# Patient Record
Sex: Female | Born: 1990 | Hispanic: No | Marital: Single | Smoking: Former smoker
Health system: Southern US, Community
[De-identification: ages and names within clinical notes are randomized; demographics above are authoritative.]

## PROBLEM LIST (undated history)

## (undated) ENCOUNTER — Emergency Department: Admission: EM | Payer: Self-pay | Source: Home / Self Care

## (undated) DIAGNOSIS — G4733 Obstructive sleep apnea (adult) (pediatric): Secondary | ICD-10-CM

## (undated) DIAGNOSIS — E119 Type 2 diabetes mellitus without complications: Secondary | ICD-10-CM

## (undated) DIAGNOSIS — M329 Systemic lupus erythematosus, unspecified: Secondary | ICD-10-CM

## (undated) DIAGNOSIS — E079 Disorder of thyroid, unspecified: Secondary | ICD-10-CM

## (undated) DIAGNOSIS — K219 Gastro-esophageal reflux disease without esophagitis: Secondary | ICD-10-CM

## (undated) DIAGNOSIS — J449 Chronic obstructive pulmonary disease, unspecified: Secondary | ICD-10-CM

## (undated) DIAGNOSIS — F329 Major depressive disorder, single episode, unspecified: Secondary | ICD-10-CM

## (undated) DIAGNOSIS — J42 Unspecified chronic bronchitis: Secondary | ICD-10-CM

## (undated) DIAGNOSIS — IMO0001 Reserved for inherently not codable concepts without codable children: Secondary | ICD-10-CM

## (undated) DIAGNOSIS — R0789 Other chest pain: Secondary | ICD-10-CM

## (undated) DIAGNOSIS — R7303 Prediabetes: Secondary | ICD-10-CM

## (undated) DIAGNOSIS — F419 Anxiety disorder, unspecified: Secondary | ICD-10-CM

## (undated) DIAGNOSIS — K589 Irritable bowel syndrome without diarrhea: Secondary | ICD-10-CM

## (undated) DIAGNOSIS — I10 Essential (primary) hypertension: Secondary | ICD-10-CM

## (undated) DIAGNOSIS — J40 Bronchitis, not specified as acute or chronic: Secondary | ICD-10-CM

## (undated) HISTORY — DX: Disorder of thyroid, unspecified: E07.9

## (undated) HISTORY — DX: Major depressive disorder, single episode, unspecified: F32.9

## (undated) HISTORY — PX: UPPER ENDOSCOPY W/ SCLEROTHERAPY: SHX2606

## (undated) HISTORY — DX: Anxiety disorder, unspecified: F41.9

## (undated) HISTORY — DX: Essential (primary) hypertension: I10

## (undated) HISTORY — DX: Chronic obstructive pulmonary disease, unspecified: J44.9

## (undated) HISTORY — DX: Gastro-esophageal reflux disease without esophagitis: K21.9

## (undated) HISTORY — DX: Reserved for inherently not codable concepts without codable children: IMO0001

---

## 2013-08-20 ENCOUNTER — Emergency Department: Payer: Self-pay | Admitting: Emergency Medicine

## 2013-08-20 LAB — COMPREHENSIVE METABOLIC PANEL
Alkaline Phosphatase: 126 U/L (ref 50–136)
Anion Gap: 5 — ABNORMAL LOW (ref 7–16)
BUN: 8 mg/dL (ref 7–18)
Bilirubin,Total: 0.3 mg/dL (ref 0.2–1.0)
Calcium, Total: 9.1 mg/dL (ref 8.5–10.1)
Chloride: 103 mmol/L (ref 98–107)
Creatinine: 0.91 mg/dL (ref 0.60–1.30)
EGFR (African American): >60
Glucose: 115 mg/dL — ABNORMAL HIGH (ref 65–99)
SGOT(AST): 15 U/L (ref 15–37)
SGPT (ALT): 20 U/L (ref 12–78)
Sodium: 136 mmol/L (ref 136–145)
Total Protein: 8.3 g/dL — ABNORMAL HIGH (ref 6.4–8.2)

## 2013-08-20 LAB — PREGNANCY, URINE: Pregnancy Test, Urine: NEGATIVE m[IU]/mL

## 2013-08-20 LAB — CBC
HGB: 12.4 g/dL (ref 12.0–16.0)
MCHC: 34.3 g/dL (ref 32.0–36.0)
MCV: 75 fL — ABNORMAL LOW (ref 80–100)
RBC: 4.82 10*6/uL (ref 3.80–5.20)
WBC: 8.9 10*3/uL (ref 3.6–11.0)

## 2013-08-20 LAB — LIPASE, BLOOD: Lipase: 93 U/L (ref 73–393)

## 2013-08-20 LAB — URINALYSIS, COMPLETE
Bilirubin,UR: NEGATIVE
Blood: NEGATIVE
Glucose,UR: NEGATIVE mg/dL (ref 0–75)
Ketone: NEGATIVE
Leukocyte Esterase: NEGATIVE
Ph: 7 (ref 4.5–8.0)
Protein: 100
RBC,UR: <1 /HPF (ref 0–5)
Specific Gravity: 1.027 (ref 1.003–1.030)
WBC UR: 2 /HPF (ref 0–5)

## 2013-09-15 ENCOUNTER — Ambulatory Visit: Payer: Self-pay | Admitting: Gastroenterology

## 2013-12-18 ENCOUNTER — Ambulatory Visit: Payer: Self-pay | Admitting: Gastroenterology

## 2014-02-08 ENCOUNTER — Ambulatory Visit (INDEPENDENT_AMBULATORY_CARE_PROVIDER_SITE_OTHER): Payer: PRIVATE HEALTH INSURANCE

## 2014-02-08 ENCOUNTER — Encounter: Payer: Self-pay | Admitting: Podiatry

## 2014-02-08 ENCOUNTER — Ambulatory Visit (INDEPENDENT_AMBULATORY_CARE_PROVIDER_SITE_OTHER): Payer: PRIVATE HEALTH INSURANCE | Admitting: Podiatry

## 2014-02-08 VITALS — BP 142/80 | HR 82 | Resp 16 | Ht 62.0 in | Wt 290.0 lb

## 2014-02-08 DIAGNOSIS — Q6650 Congenital pes planus, unspecified foot: Secondary | ICD-10-CM

## 2014-02-08 DIAGNOSIS — M722 Plantar fascial fibromatosis: Secondary | ICD-10-CM

## 2014-02-08 DIAGNOSIS — L723 Sebaceous cyst: Secondary | ICD-10-CM

## 2014-02-08 DIAGNOSIS — M6740 Ganglion, unspecified site: Secondary | ICD-10-CM

## 2014-02-08 NOTE — Progress Notes (Signed)
   Subjective:    Patient ID: Katrina Meyers, female    DOB: December 23, 1990, 23 y.o.   MRN: [id number]  HPI Comments: KNOTS ON THE TOP OF BOTH FEET, ITS BEEN THERE A FEW MONTHS. DO HAVE NUMBNESS IN BOTH FEET AND GOES UP THE LEGS , STARTS AT THE SIDES. AND THE LEFT FOOT HAS SOME SWELLING , WALKING UP STAIRS THE ANKLES CRACK A LOT      Review of Systems  All other systems reviewed and are negative.      Objective:   Physical Exam: I have reviewed her past medical history medications allergies surgeries social history and review of systems. Pulses are strongly palpable bilateral neurologic sensorium is intact per since once the monofilament. Deep tendon reflexes are intact bilateral. Muscle strength is 5 over 5 dorsiflexors plantar flexors inverters everters all intrinsic musculature intact orthopedic evaluation demonstrates pes planus noted bilateral. Cutaneous evaluation does demonstrate supple while hydrated cutis with exception of ganglion tumor to the dorsal aspect of the extensor tendons bilateral foot. Very early minor bunion deformities. Positive pain on palpation medial continued tubercles bilateral heels.        Assessment & Plan:  Assessment: Pes planus bilateral. Ganglion cyst I lateral.  Plan: Discussed surgical needs for ganglion cyst. She was scanned for orthotics today due to her flat feet and heel pain.

## 2014-02-19 ENCOUNTER — Encounter: Payer: Self-pay | Admitting: *Deleted

## 2014-02-19 NOTE — Progress Notes (Signed)
Sent pt post card letting her know orthotics are here. 

## 2014-03-26 DIAGNOSIS — M79609 Pain in unspecified limb: Secondary | ICD-10-CM | POA: Insufficient documentation

## 2015-06-18 ENCOUNTER — Encounter: Payer: Self-pay | Admitting: Emergency Medicine

## 2015-06-18 ENCOUNTER — Emergency Department: Payer: Self-pay

## 2015-06-18 ENCOUNTER — Emergency Department
Admission: EM | Admit: 2015-06-18 | Discharge: 2015-06-18 | Disposition: A | Discharge status: Home / Self Care | Payer: Self-pay | Attending: Emergency Medicine | Admitting: Emergency Medicine

## 2015-06-18 DIAGNOSIS — R42 Dizziness and giddiness: Secondary | ICD-10-CM | POA: Insufficient documentation

## 2015-06-18 DIAGNOSIS — R51 Headache: Secondary | ICD-10-CM | POA: Insufficient documentation

## 2015-06-18 DIAGNOSIS — R519 Headache, unspecified: Secondary | ICD-10-CM

## 2015-06-18 DIAGNOSIS — Z720 Tobacco use: Secondary | ICD-10-CM | POA: Insufficient documentation

## 2015-06-18 DIAGNOSIS — I10 Essential (primary) hypertension: Secondary | ICD-10-CM | POA: Insufficient documentation

## 2015-06-18 DIAGNOSIS — H53149 Visual discomfort, unspecified: Secondary | ICD-10-CM | POA: Insufficient documentation

## 2015-06-18 MED ORDER — MECLIZINE HCL 25 MG PO TABS
25.0000 mg | ORAL_TABLET | Freq: Once | ORAL | Status: AC
  Administered 2015-06-18: 25 mg via ORAL
  Filled 2015-06-18: qty 1

## 2015-06-18 MED ORDER — SODIUM CHLORIDE 0.9 % IV BOLUS (SEPSIS)
1000.0000 mL | Freq: Once | INTRAVENOUS | Status: AC
  Administered 2015-06-18: 1000 mL via INTRAVENOUS

## 2015-06-18 MED ORDER — MECLIZINE HCL 32 MG PO TABS: 32.0000 mg | ORAL_TABLET | Freq: Three times a day (TID) | ORAL | Status: DC | PRN

## 2015-06-18 MED ORDER — BUTALBITAL-APAP-CAFFEINE 50-325-40 MG PO TABS: 1.0000 | ORAL_TABLET | Freq: Four times a day (QID) | ORAL | Status: AC | PRN

## 2015-06-18 MED ORDER — PROCHLORPERAZINE EDISYLATE 5 MG/ML IJ SOLN
10.0000 mg | Freq: Once | INTRAMUSCULAR | Status: AC
  Administered 2015-06-18: 10 mg via INTRAVENOUS
  Filled 2015-06-18: qty 2

## 2015-06-18 MED ORDER — DIPHENHYDRAMINE HCL 50 MG/ML IJ SOLN
25.0000 mg | Freq: Once | INTRAMUSCULAR | Status: AC
  Administered 2015-06-18: 25 mg via INTRAVENOUS
  Filled 2015-06-18: qty 1

## 2015-06-18 NOTE — Discharge Instructions (Signed)
°Dizziness ° Dizziness means you feel unsteady or lightheaded. You might feel like you are going to pass out (faint). °HOME CARE  °· Drink enough fluids to keep your pee (urine) clear or pale yellow. °· Take your medicines exactly as told by your doctor. If you take blood pressure medicine, always stand up slowly from the lying or sitting position. Hold on to something to steady yourself. °· If you need to stand in one place for a long time, move your legs often. Tighten and relax your leg muscles. °· Have someone stay with you until you feel okay. °· Do not drive or use heavy machinery if you feel dizzy. °· Do not drink alcohol. °GET HELP RIGHT AWAY IF:  °· You feel dizzy or lightheaded and it gets worse. °· You feel sick to your stomach (nauseous), or you throw up (vomit). °· You have trouble talking or walking. °· You feel weak or have trouble using your arms, hands, or legs. °· You cannot think clearly or have trouble forming sentences. °· You have chest pain, belly (abdominal) pain, sweating, or you are short of breath. °· Your vision changes. °· You are bleeding. °· You have problems from your medicine that seem to be getting worse. °MAKE SURE YOU:  °· Understand these instructions. °· Will watch your condition. °· Will get help right away if you are not doing well or get worse. °Document Released: 09/20/2011 Document Revised: 12/24/2011 Document Reviewed: 09/20/2011 °ExitCare® Patient Information ©2015 [employer]. This information is not intended to replace advice given to you by your health care provider. Make sure you discuss any questions you have with your health care provider. °Headaches, Frequently Asked Questions °MIGRAINE HEADACHES °Q: What is migraine? What causes it? How can I treat it? °A: Generally, migraine headaches begin as a dull ache. Then they develop into a constant, throbbing, and pulsating pain. You may experience pain at the temples. You may experience pain at the front or back of  one or both sides of the head. The pain is usually accompanied by a combination of: °· Nausea. °· Vomiting. °· Sensitivity to light and noise. °Some people (about 15%) experience an aura (see below) before an attack. The cause of migraine is believed to be chemical reactions in the brain. Treatment for migraine may include over-the-counter or prescription medications. It may also include self-help techniques. These include relaxation training and biofeedback.  °Q: What is an aura? °A: About 15% of people with migraine get an "aura". This is a sign of neurological symptoms that occur before a migraine headache. You may see wavy or jagged lines, dots, or flashing lights. You might experience tunnel vision or blind spots in one or both eyes. The aura can include visual or auditory hallucinations (something imagined). It may include disruptions in smell (such as strange odors), taste or touch. Other symptoms include: °· Numbness. °· A "pins and needles" sensation. °· Difficulty in recalling or speaking the correct word. °These neurological events may last as long as 60 minutes. These symptoms will fade as the headache begins. °Q: What is a trigger? °A: Certain physical or environmental factors can lead to or "trigger" a migraine. These include: °· Foods. °· Hormonal changes. °· Weather. °· Stress. °It is important to remember that triggers are different for everyone. To help prevent migraine attacks, you need to figure out which triggers affect you. Keep a headache diary. This is a good way to track triggers. The diary will help you talk to   your healthcare professional about your condition. °Q: Does weather affect migraines? °A: Bright sunshine, hot, humid conditions, and drastic changes in barometric pressure may lead to, or "trigger," a migraine attack in some people. But studies have shown that weather does not act as a trigger for everyone with migraines. °Q: What is the link between migraine and hormones? °A:  Hormones start and regulate many of your body's functions. Hormones keep your body in balance within a constantly changing environment. The levels of hormones in your body are unbalanced at times. Examples are during menstruation, pregnancy, or menopause. That can lead to a migraine attack. In fact, about three quarters of all women with migraine report that their attacks are related to the menstrual cycle.  °Q: Is there an increased risk of stroke for migraine sufferers? °A: The likelihood of a migraine attack causing a stroke is very remote. That is not to say that migraine sufferers cannot have a stroke associated with their migraines. In persons under age 40, the most common associated factor for stroke is migraine headache. But over the course of a person's normal life span, the occurrence of migraine headache may actually be associated with a reduced risk of dying from cerebrovascular disease due to stroke.  °Q: What are acute medications for migraine? °A: Acute medications are used to treat the pain of the headache after it has started. Examples over-the-counter medications, NSAIDs, ergots, and triptans.  °Q: What are the triptans? °A: Triptans are the newest class of abortive medications. They are specifically targeted to treat migraine. Triptans are vasoconstrictors. They moderate some chemical reactions in the brain. The triptans work on receptors in your brain. Triptans help to restore the balance of a neurotransmitter called serotonin. Fluctuations in levels of serotonin are thought to be a main cause of migraine.  °Q: Are over-the-counter medications for migraine effective? °A: Over-the-counter, or "OTC," medications may be effective in relieving mild to moderate pain and associated symptoms of migraine. But you should see your caregiver before beginning any treatment regimen for migraine.  °Q: What are preventive medications for migraine? °A: Preventive medications for migraine are sometimes referred  to as "prophylactic" treatments. They are used to reduce the frequency, severity, and length of migraine attacks. Examples of preventive medications include antiepileptic medications, antidepressants, beta-blockers, calcium channel blockers, and NSAIDs (nonsteroidal anti-inflammatory drugs). °Q: Why are anticonvulsants used to treat migraine? °A: During the past few years, there has been an increased interest in antiepileptic drugs for the prevention of migraine. They are sometimes referred to as "anticonvulsants". Both epilepsy and migraine may be caused by similar reactions in the brain.  °Q: Why are antidepressants used to treat migraine? °A: Antidepressants are typically used to treat people with depression. They may reduce migraine frequency by regulating chemical levels, such as serotonin, in the brain.  °Q: What alternative therapies are used to treat migraine? °A: The term "alternative therapies" is often used to describe treatments considered outside the scope of conventional Western medicine. Examples of alternative therapy include acupuncture, acupressure, and yoga. Another common alternative treatment is herbal therapy. Some herbs are believed to relieve headache pain. Always discuss alternative therapies with your caregiver before proceeding. Some herbal products contain arsenic and other toxins. °TENSION HEADACHES °Q: What is a tension-type headache? What causes it? How can I treat it? °A: Tension-type headaches occur randomly. They are often the result of temporary stress, anxiety, fatigue, or anger. Symptoms include soreness in your temples, a tightening band-like sensation around your head (a "  vice-like" ache). Symptoms can also include a pulling feeling, pressure sensations, and contracting head and neck muscles. The headache begins in your forehead, temples, or the back of your head and neck. Treatment for tension-type headache may include over-the-counter or prescription medications. Treatment  may also include self-help techniques such as relaxation training and biofeedback. °CLUSTER HEADACHES °Q: What is a cluster headache? What causes it? How can I treat it? °A: Cluster headache gets its name because the attacks come in groups. The pain arrives with little, if any, warning. It is usually on one side of the head. A tearing or bloodshot eye and a runny nose on the same side of the headache may also accompany the pain. Cluster headaches are believed to be caused by chemical reactions in the brain. They have been described as the most severe and intense of any headache type. Treatment for cluster headache includes prescription medication and oxygen. °SINUS HEADACHES °Q: What is a sinus headache? What causes it? How can I treat it? °A: When a cavity in the bones of the face and skull (a sinus) becomes inflamed, the inflammation will cause localized pain. This condition is usually the result of an allergic reaction, a tumor, or an infection. If your headache is caused by a sinus blockage, such as an infection, you will probably have a fever. An x-ray will confirm a sinus blockage. Your caregiver's treatment might include antibiotics for the infection, as well as antihistamines or decongestants.  °REBOUND HEADACHES °Q: What is a rebound headache? What causes it? How can I treat it? °A: A pattern of taking acute headache medications too often can lead to a condition known as "rebound headache." A pattern of taking too much headache medication includes taking it more than 2 days per week or in excessive amounts. That means more than the label or a caregiver advises. With rebound headaches, your medications not only stop relieving pain, they actually begin to cause headaches. Doctors treat rebound headache by tapering the medication that is being overused. Sometimes your caregiver will gradually substitute a different type of treatment or medication. Stopping may be a challenge. Regularly overusing a medication  increases the potential for serious side effects. Consult a caregiver if you regularly use headache medications more than 2 days per week or more than the label advises. °ADDITIONAL QUESTIONS AND ANSWERS °Q: What is biofeedback? °A: Biofeedback is a self-help treatment. Biofeedback uses special equipment to monitor your body's involuntary physical responses. Biofeedback monitors: °· Breathing. °· Pulse. °· Heart rate. °· Temperature. °· Muscle tension. °· Brain activity. °Biofeedback helps you refine and perfect your relaxation exercises. You learn to control the physical responses that are related to stress. Once the technique has been mastered, you do not need the equipment any more. °Q: Are headaches hereditary? °A: Four out of five (80%) of people that suffer report a family history of migraine. Scientists are not sure if this is genetic or a family predisposition. Despite the uncertainty, a child has a 50% chance of having migraine if one parent suffers. The child has a 75% chance if both parents suffer.  °Q: Can children get headaches? °A: By the time they reach high school, most young people have experienced some type of headache. Many safe and effective approaches or medications can prevent a headache from occurring or stop it after it has begun.  °Q: What type of doctor should I see to diagnose and treat my headache? °A: Start with your primary caregiver. Discuss his or her   experience and approach to headaches. Discuss methods of classification, diagnosis, and treatment. Your caregiver may decide to recommend you to a headache specialist, depending upon your symptoms or other physical conditions. Having diabetes, allergies, etc., may require a more comprehensive and inclusive approach to your headache. The [employer] will provide, upon request, a list of NHF physician members in your state. °Document Released: 12/22/2003 Document Revised: 12/24/2011 Document Reviewed: 05/31/2008 °ExitCare®  Patient Information ©2015 [employer]. This information is not intended to replace advice given to you by your health care provider. Make sure you discuss any questions you have with your health care provider. ° °

## 2015-06-18 NOTE — ED Notes (Signed)
This nurse made 2 attempts to start an IV, both attempts unsuccessful.

## 2015-06-18 NOTE — ED Provider Notes (Signed)
[facility] Emergency Department Provider Note  ____________________________________________  Time seen: Approximately 930 AM  I have reviewed the triage vital signs and the nursing notes.   HISTORY  Chief Complaint Dizziness    HPI Katrina Meyers is a 24 y.o. female with multiple episodes of headache and dizziness was presenting with an episode of headache and dizziness. Said that the headache was sudden onset headache that started last night at maximum intensity. She says it was a 12 out of 10. She says since then it has resolved to a 9 out of 10. The headache is frontal and associated with dizziness with movement. She denies any ringing or roaring in her ears. The light bothers her. She has had 2 other episodes such as this in the past, the first 7 years ago. She has seen a primary care doctor but never been evaluate by a neurologist. She has no history of cerebral aneurysm in her family.   Past Medical History  Diagnosis Date  . Thyroid disease   . Hypertension   . Reflux     There are no active problems to display for this patient.   Past Surgical History  Procedure Laterality Date  . Upper endoscopy w/ sclerotherapy      No current outpatient prescriptions on file.  Allergies Reglan  History reviewed. No pertinent family history.  Social History Social History  Substance Use Topics  . Smoking status: Current Some Day Smoker  . Smokeless tobacco: Never Used  . Alcohol Use: No    Review of Systems Constitutional: No fever/chills Eyes: Photophobia  ENT: No sore throat. Cardiovascular: Denies chest pain. Respiratory: Denies shortness of breath. Gastrointestinal: No abdominal pain.  No nausea, no vomiting.  No diarrhea.  No constipation. Genitourinary: Negative for dysuria. Musculoskeletal: Negative for back pain. Skin: Negative for rash. Neurological: Negative focal weakness or numbness.  10-point ROS otherwise  negative.  ____________________________________________   PHYSICAL EXAM:  VITAL SIGNS: ED Triage Vitals  Enc Vitals Group     BP 06/18/15 0819 115/71 mmHg     Pulse Rate 06/18/15 0819 87     Resp 06/18/15 0819 18     Temp 06/18/15 0819 98 F (36.7 C)     Temp Source 06/18/15 0819 Oral     SpO2 06/18/15 0819 98 %     Weight 06/18/15 0819 291 lb (131.997 kg)     Height 06/18/15 0819  (1.575 m)     Head Cir --      Peak Flow --      Pain Score 06/18/15 0819 9     Pain Loc --      Pain Edu? --      Excl. in GC? --     Constitutional: Alert and oriented. Well appearing and in no acute distress. Eyes: Conjunctivae are normal. PERRL. EOMI. Head: Atraumatic. Nose: No congestion/rhinnorhea. Mouth/Throat: Mucous membranes are moist.  Oropharynx non-erythematous. Neck: No stridor.   Cardiovascular: Normal rate, regular rhythm. Grossly normal heart sounds.  Good peripheral circulation. Respiratory: Normal respiratory effort.  No retractions. Lungs CTAB. Gastrointestinal: Soft and nontender. No distention. No abdominal bruits. No CVA tenderness. Musculoskeletal: No lower extremity tenderness nor edema.  No joint effusions. Neurologic:  Normal speech and language. No gross focal neurologic deficits are appreciated. No gait instability. No ataxia on finger to nose or heel shin. No nystagmus. No meningismus. The neck is supple and ranges freely.Skin:  Skin is warm, dry and intact. No rash noted. Psychiatric:  Mood and affect are normal. Speech and behavior are normal.  ____________________________________________   LABS (all labs ordered are listed, but only abnormal results are displayed)  Labs Reviewed - No data to display ____________________________________________  EKG   ____________________________________________  RADIOLOGY  Normal CT of the  brain. ____________________________________________   PROCEDURES    ____________________________________________   INITIAL IMPRESSION / ASSESSMENT AND PLAN / ED COURSE  Pertinent labs & imaging results that were available during my care of the patient were reviewed by me and considered in my medical decision making (see chart for details).  ----------------------------------------- 11:26 AM on 06/18/2015 -----------------------------------------  Patient is now headache as well as dizziness free after meds. Discussed with her that this could possibly be migraine but I'm concerned because of the sudden onset aspect of the headache. She is aware of my concern for cerebral aneurysm and the potential effects of rupture.  She reiterates that she has no history of cerebral aneurysm in her family. We discussed LP but the patient does not want the lumbar puncture at this time. She will follow up with neurology. ____________________________________________   FINAL CLINICAL IMPRESSION(S) / ED DIAGNOSES  Acute headache with vertigo. Initial visit.    Myrna Blazer, MD 06/18/15 [id number]

## 2015-06-18 NOTE — ED Notes (Signed)
Reports dizziness since yesterday.  States she has also had a ha and has been out of her bp meds for a while. Also reports diarrhea. States she needs a note for work also.

## 2016-06-28 DIAGNOSIS — E039 Hypothyroidism, unspecified: Secondary | ICD-10-CM | POA: Insufficient documentation

## 2016-10-15 DIAGNOSIS — R87612 Low grade squamous intraepithelial lesion on cytologic smear of cervix (LGSIL): Secondary | ICD-10-CM | POA: Insufficient documentation

## 2016-10-23 ENCOUNTER — Telehealth: Payer: Self-pay | Admitting: Podiatry

## 2016-10-23 NOTE — Telephone Encounter (Signed)
Left message with father about orthotics that has been here since 2015. He states that he will give her the message and have her call back

## 2017-03-14 ENCOUNTER — Encounter: Payer: Self-pay | Admitting: Emergency Medicine

## 2017-03-14 ENCOUNTER — Emergency Department
Admission: EM | Admit: 2017-03-14 | Discharge: 2017-03-14 | Disposition: A | Discharge status: Home / Self Care | Payer: 59 | Attending: Emergency Medicine | Admitting: Emergency Medicine

## 2017-03-14 ENCOUNTER — Emergency Department: Payer: 59

## 2017-03-14 DIAGNOSIS — I10 Essential (primary) hypertension: Secondary | ICD-10-CM | POA: Insufficient documentation

## 2017-03-14 DIAGNOSIS — F17200 Nicotine dependence, unspecified, uncomplicated: Secondary | ICD-10-CM | POA: Diagnosis not present

## 2017-03-14 DIAGNOSIS — K625 Hemorrhage of anus and rectum: Secondary | ICD-10-CM | POA: Diagnosis not present

## 2017-03-14 DIAGNOSIS — R109 Unspecified abdominal pain: Secondary | ICD-10-CM

## 2017-03-14 HISTORY — DX: Irritable bowel syndrome, unspecified: K58.9

## 2017-03-14 LAB — COMPREHENSIVE METABOLIC PANEL
ALK PHOS: 107 U/L (ref 38–126)
ALT: 18 U/L (ref 14–54)
ANION GAP: 7 (ref 5–15)
AST: 18 U/L (ref 15–41)
Albumin: 3.5 g/dL (ref 3.5–5.0)
BILIRUBIN TOTAL: 0.5 mg/dL (ref 0.3–1.2)
BUN: 9 mg/dL (ref 6–20)
CALCIUM: 9.3 mg/dL (ref 8.9–10.3)
CO2: 28 mmol/L (ref 22–32)
Chloride: 103 mmol/L (ref 101–111)
Creatinine, Ser: 0.79 mg/dL (ref 0.44–1.00)
Glucose, Bld: 92 mg/dL (ref 65–99)
Potassium: 4 mmol/L (ref 3.5–5.1)
Sodium: 138 mmol/L (ref 135–145)
TOTAL PROTEIN: 8.4 g/dL — AB (ref 6.5–8.1)

## 2017-03-14 LAB — URINALYSIS, COMPLETE (UACMP) WITH MICROSCOPIC
BILIRUBIN URINE: NEGATIVE
Bacteria, UA: NONE SEEN
Glucose, UA: NEGATIVE mg/dL
HGB URINE DIPSTICK: NEGATIVE
KETONES UR: NEGATIVE mg/dL
LEUKOCYTES UA: NEGATIVE
NITRITE: NEGATIVE
PH: 5 (ref 5.0–8.0)
Protein, ur: 100 mg/dL — AB
SPECIFIC GRAVITY, URINE: 1.025 (ref 1.005–1.030)

## 2017-03-14 LAB — CBC
HCT: 38.6 % (ref 35.0–47.0)
HEMOGLOBIN: 12.7 g/dL (ref 12.0–16.0)
MCH: 25 pg — ABNORMAL LOW (ref 26.0–34.0)
MCHC: 33 g/dL (ref 32.0–36.0)
MCV: 75.8 fL — ABNORMAL LOW (ref 80.0–100.0)
Platelets: 271 10*3/uL (ref 150–440)
RBC: 5.09 MIL/uL (ref 3.80–5.20)
RDW: 17.1 % — ABNORMAL HIGH (ref 11.5–14.5)
WBC: 8.5 10*3/uL (ref 3.6–11.0)

## 2017-03-14 LAB — TYPE AND SCREEN
ABO/RH(D): O NEG
ANTIBODY SCREEN: NEGATIVE

## 2017-03-14 LAB — POCT PREGNANCY, URINE: PREG TEST UR: NEGATIVE

## 2017-03-14 MED ORDER — LINACLOTIDE 72 MCG PO CAPS: 72.0000 ug | ORAL_CAPSULE | Freq: Every day | ORAL | 1 refills | Status: DC

## 2017-03-14 NOTE — ED Triage Notes (Signed)
Pt reports that she is having abd pain and right flank pain - she has a history of IBS and rotates from diarrhea to constipation - yesterday she started with bright red blood in stool and is concerned because this is a new symptom

## 2017-03-14 NOTE — ED Notes (Signed)
Patient transported to radiology

## 2017-03-14 NOTE — ED Provider Notes (Signed)
[facility] Emergency Department Provider Note       Time seen: ----------------------------------------- 2:37 PM on 03/14/2017 -----------------------------------------     I have reviewed the triage vital signs and the nursing notes.   HISTORY   Chief Complaint Blood In Stools; Flank Pain; and Abdominal Pain    HPI Katrina Meyers is a 26 y.o. female who presents to the ED for abdominal pain and flank pain. Patient reports a history of IBS and she varies from having diarrhea to constipation. Yesterday she started with bright red blood in her stool was concerned because she has never had this before. She does not have any pain when she has a bowel movement. She denies fevers, chills, chest pain, shortness of breath or vomiting.   Past Medical History:  Diagnosis Date  . Hypertension   . IBS (irritable bowel syndrome)   . Reflux   . Thyroid disease     There are no active problems to display for this patient.   Past Surgical History:  Procedure Laterality Date  . UPPER ENDOSCOPY W/ SCLEROTHERAPY      Allergies Reglan [metoclopramide]  Social History Social History  Substance Use Topics  . Smoking status: Current Some Day Smoker  . Smokeless tobacco: Never Used  . Alcohol use No    Review of Systems Constitutional: Negative for fever. Cardiovascular: Negative for chest pain. Respiratory: Negative for shortness of breath. Gastrointestinal: Positive for abdominal pain, rectal bleeding Genitourinary: Negative for dysuria. Musculoskeletal: Negative for back pain. Skin: Negative for rash. Neurological: Negative for headaches, focal weakness or numbness.  All systems negative/normal/unremarkable except as stated in the HPI  ____________________________________________   PHYSICAL EXAM:  VITAL SIGNS: ED Triage Vitals  Enc Vitals Group     BP 03/14/17 1259 (!) 163/105     Pulse Rate 03/14/17 1259 86     Resp 03/14/17 1259 16   Temp 03/14/17 1259 98.6 F (37 C)     Temp Source 03/14/17 1259 Oral     SpO2 03/14/17 1259 94 %     Weight 03/14/17 1229 (!) 317 lb (143.8 kg)     Height 03/14/17 1229 5\' 2"  (1.575 m)     Head Circumference --      Peak Flow --      Pain Score 03/14/17 1229 10     Pain Loc --      Pain Edu? --      Excl. in GC? --     Constitutional: Alert and oriented. Well appearing and in no distress.Obese Eyes: Conjunctivae are normal. Normal extraocular movements. ENT   Head: Normocephalic and atraumatic.   Nose: No congestion/rhinnorhea.   Mouth/Throat: Mucous membranes are moist.   Neck: No stridor. Cardiovascular: Normal rate, regular rhythm. No murmurs, rubs, or gallops. Respiratory: Normal respiratory effort without tachypnea nor retractions. Breath sounds are clear and equal bilaterally. No wheezes/rales/rhonchi. Gastrointestinal: Nonfocal tenderness, no rebound or guarding. Normal bowel sounds.Mildly tender rectal examination, no blood or hemorrhoids are present, no stool Musculoskeletal: Nontender with normal range of motion in extremities. No lower extremity tenderness nor edema. Neurologic:  Normal speech and language. No gross focal neurologic deficits are appreciated.  Skin:  Skin is warm, dry and intact. No rash noted. Psychiatric: Mood and affect are normal. Speech and behavior are normal.  ___________________________________________  ED COURSE:  Pertinent labs & imaging results that were available during my care of the patient were reviewed by me and considered in my medical decision making (see chart  for details). Patient presents for abdominal pain, we will assess with labs and imaging as indicated.   Procedures ____________________________________________   LABS (pertinent positives/negatives)  Labs Reviewed  COMPREHENSIVE METABOLIC PANEL - Abnormal; Notable for the following:       Result Value   Total Protein 8.4 (*)    All other components within  normal limits  CBC - Abnormal; Notable for the following:    MCV 75.8 (*)    MCH 25.0 (*)    RDW 17.1 (*)    All other components within normal limits  URINALYSIS, COMPLETE (UACMP) WITH MICROSCOPIC  POCT PREGNANCY, URINE  POC URINE PREG, ED  POC OCCULT BLOOD, ED  TYPE AND SCREEN    RADIOLOGY  Abdomen 2 view Is unremarkable ____________________________________________  FINAL ASSESSMENT AND PLAN  Abdominal pain, rectal bleeding   Plan: Patient's labs and imaging were dictated above. Patient had presented forabdominal pain and rectal bleeding. No specific etiology was discovered. Labs and workup here have been negative. She is stable for outpatient follow-up.   Emily FilbertWilliams, Jonathan E, MD   Note: This note was generated in part or whole with voice recognition software. Voice recognition is usually quite accurate but there are transcription errors that can and very often do occur. I apologize for any typographical errors that were not detected and corrected.     Emily FilbertWilliams, Jonathan E, MD 03/14/17 [phone]

## 2017-03-14 NOTE — ED Notes (Signed)
Pt presents from the walk in clinic. Pt c/o lower back pain, L sided flank pain, and blood in her stools. Pt states hx of IBS at this time.

## 2017-05-20 ENCOUNTER — Ambulatory Visit: Payer: 59 | Admitting: Dietician

## 2017-05-27 ENCOUNTER — Encounter: Payer: 59 | Attending: Student | Admitting: Dietician

## 2017-05-27 VITALS — Ht 62.0 in | Wt 328.3 lb

## 2017-05-27 DIAGNOSIS — R7303 Prediabetes: Secondary | ICD-10-CM | POA: Insufficient documentation

## 2017-05-27 DIAGNOSIS — Z713 Dietary counseling and surveillance: Secondary | ICD-10-CM | POA: Insufficient documentation

## 2017-05-27 DIAGNOSIS — Z6841 Body Mass Index (BMI) 40.0 and over, adult: Secondary | ICD-10-CM | POA: Insufficient documentation

## 2017-05-27 DIAGNOSIS — I10 Essential (primary) hypertension: Secondary | ICD-10-CM | POA: Diagnosis not present

## 2017-05-27 DIAGNOSIS — IMO0001 Reserved for inherently not codable concepts without codable children: Secondary | ICD-10-CM

## 2017-05-27 DIAGNOSIS — E669 Obesity, unspecified: Secondary | ICD-10-CM | POA: Diagnosis not present

## 2017-05-27 NOTE — Progress Notes (Signed)
Medical Nutrition Therapy: Visit start time: 1515  end time: 1645  Assessment:  Diagnosis: pre-diabetes, obesity, HTN Past medical history: GERD, IBS per patient Psychosocial issues/ stress concerns: patient reports high stress level, some mild depression, but states she is dealing "OK" with stress issues.  Preferred learning method:  . Auditory . Visual . Hands-on  Current weight: 328.3lbs  Height: 5'2" Medications, supplements: reconciled list in medical record  Progress and evaluation: Patient reports history of obesity, but she is currently at her highest weight. She voices concern over intermittent pain, blood pressure, and blood sugar control, and wants to improve these through weight loss. She is adopting a Museum/gallery conservatorstepdaughter and is motivated to make lifestyle changes so she can care for and be fully engaged in her stepdaughter's life. Patient feels her diet is poor right now due to frequent fast food meals and erratic eating times; she did follow a low-carb diet for a while with some success; used MyFitnessPal to count calories with some success. She reports GI symptoms when drinking milk, but loves milk and continues to drink lowfat milk for now.    Physical activity: ADLs  Dietary Intake:  Usual eating pattern includes 2 meals and 0-1 snacks per day. Dining out frequency: 6 meals per week. Many days will graze rather than eat complete meals due to poor appetite  Breakfast: none typically Snack: occasional yogurt, jello, or sun chips Lunch: fast foods--usually chicken; sometimes leftovers Snack: yogurt, jello, sun chips occasionally Supper: often 11pm-12am often fast foods; sometimes cooked meals Snack: none Beverages: apple or cranberry juices, [employer] Monster drinks, flavored waters, bottled or filtered water, 1% milk, sometimes ginger ale or sprite, rarely dark soda.   Nutrition Care Education: Topics covered: weight control, diabetes prevention, IBS Basic nutrition: basic food  groups, appropriate nutrient balance, appropriate meal and snack schedule, general nutrition guidelines    Weight control: benefits of weight control, provided meal plan for 1300-1500kcal with 40% CHO, 30% protein, and 30% fat.  Advanced nutrition: dining out, food label reading Diabetes prevention: appropriate meal and snack schedule, appropriate carb intake and balance, role of protein and fat in blood sugar control, role of exercise. IBS: foods that can trigger IBS symptoms, such as caffeine, high fat foods  Nutritional Diagnosis:  [place]-2.2 Altered nutrition-related laboratory As related to pre-diabetes.  As evidenced by lab report, history of hyperglycemia. [place]-3.3 Overweight/obesity As related to hypthyroid, excess calories, inactivity.  As evidenced by BMI 59.9, patient report.  Intervention: Instruction as noted above.   Set goals with direction from patient.    She would like to resume calorie counting with phone app, not ready to focus on more detailed meal planning yet.    Encouraged patient to reduce caffeine intake from energy drinks.    Encouraged patient to avoid lactose-containing foods.     Education Materials given:  . Food lists/ Planning A Balanced Meal . Sample meal pattern/ menus . Goals/ instructions  Learner/ who was taught:  . Patient   Level of understanding: Marland Kitchen. Verbalizes/ demonstrates competency  Demonstrated degree of understanding via:   Teach back Learning barriers: . None  Willingness to learn/ readiness for change: . Eager, change in progress  Monitoring and Evaluation:  Dietary intake, exercise, BG control, GI symptoms, and body weight      follow up: 07/01/17

## 2017-05-27 NOTE — Patient Instructions (Signed)
   Have some balanced nutrition in a meal or snack 3-4 times a day. Have something within 2 hours of waking up (snack/ breakfast), then another snack, or a lunch 3-5 hours later depending on break times. Eat a light meal for your nighttime meal, 10-11:30pm.   Gradually reduce [place] [employer] [place] Monster drinks to decrease caffeine as well as sugar intake.   Scale back on fruit juices, and increase low sugar flavored waters instead. Or try light juice blends.   Use MyFitnessPal to track your food intake, and aim for 1400 calories each day.

## 2017-05-28 DIAGNOSIS — I10 Essential (primary) hypertension: Secondary | ICD-10-CM | POA: Insufficient documentation

## 2017-06-22 ENCOUNTER — Emergency Department
Admission: EM | Admit: 2017-06-22 | Discharge: 2017-06-23 | Disposition: A | Discharge status: Home / Self Care | Payer: 59 | Attending: Emergency Medicine | Admitting: Emergency Medicine

## 2017-06-22 ENCOUNTER — Other Ambulatory Visit: Payer: Self-pay

## 2017-06-22 ENCOUNTER — Encounter: Payer: Self-pay | Admitting: Emergency Medicine

## 2017-06-22 ENCOUNTER — Emergency Department: Payer: 59

## 2017-06-22 DIAGNOSIS — Z79899 Other long term (current) drug therapy: Secondary | ICD-10-CM | POA: Diagnosis not present

## 2017-06-22 DIAGNOSIS — E039 Hypothyroidism, unspecified: Secondary | ICD-10-CM | POA: Insufficient documentation

## 2017-06-22 DIAGNOSIS — F17210 Nicotine dependence, cigarettes, uncomplicated: Secondary | ICD-10-CM | POA: Insufficient documentation

## 2017-06-22 DIAGNOSIS — I10 Essential (primary) hypertension: Secondary | ICD-10-CM | POA: Diagnosis not present

## 2017-06-22 DIAGNOSIS — R1012 Left upper quadrant pain: Secondary | ICD-10-CM | POA: Diagnosis not present

## 2017-06-22 DIAGNOSIS — K589 Irritable bowel syndrome without diarrhea: Secondary | ICD-10-CM | POA: Diagnosis not present

## 2017-06-22 DIAGNOSIS — R160 Hepatomegaly, not elsewhere classified: Secondary | ICD-10-CM

## 2017-06-22 DIAGNOSIS — K219 Gastro-esophageal reflux disease without esophagitis: Secondary | ICD-10-CM | POA: Insufficient documentation

## 2017-06-22 DIAGNOSIS — R0602 Shortness of breath: Secondary | ICD-10-CM | POA: Insufficient documentation

## 2017-06-22 LAB — CBC
HEMATOCRIT: 35.9 % (ref 35.0–47.0)
HEMOGLOBIN: 12 g/dL (ref 12.0–16.0)
MCH: 25.2 pg — AB (ref 26.0–34.0)
MCHC: 33.4 g/dL (ref 32.0–36.0)
MCV: 75.4 fL — AB (ref 80.0–100.0)
Platelets: 263 10*3/uL (ref 150–440)
RBC: 4.76 MIL/uL (ref 3.80–5.20)
RDW: 16.3 % — AB (ref 11.5–14.5)
WBC: 11.5 10*3/uL — ABNORMAL HIGH (ref 3.6–11.0)

## 2017-06-22 LAB — BASIC METABOLIC PANEL
ANION GAP: 8 (ref 5–15)
BUN: 10 mg/dL (ref 6–20)
CO2: 25 mmol/L (ref 22–32)
Calcium: 9 mg/dL (ref 8.9–10.3)
Chloride: 104 mmol/L (ref 101–111)
Creatinine, Ser: 0.75 mg/dL (ref 0.44–1.00)
GFR calc Af Amer: >60 mL/min (ref >60)
GFR calc non Af Amer: >60 mL/min (ref >60)
GLUCOSE: 145 mg/dL — AB (ref 65–99)
POTASSIUM: 3.7 mmol/L (ref 3.5–5.1)
Sodium: 137 mmol/L (ref 135–145)

## 2017-06-22 LAB — TROPONIN I: Troponin I: <0.03 ng/mL (ref <0.03)

## 2017-06-22 MED ORDER — MORPHINE SULFATE (PF) 2 MG/ML IV SOLN
2.0000 mg | Freq: Once | INTRAVENOUS | Status: AC
  Administered 2017-06-22: 2 mg via INTRAVENOUS
  Filled 2017-06-22: qty 1

## 2017-06-22 NOTE — ED Notes (Signed)
Pt ambulatory with steady gait to treatment room 

## 2017-06-22 NOTE — ED Provider Notes (Signed)
[facility] Emergency Department Provider Note    First MD Initiated Contact with Patient 06/22/17 2309     (approximate)  I have reviewed the triage vital signs and the nursing notes.   HISTORY  Chief Complaint Chest Pain and Shortness of Breath    HPI Katrina Meyers is a 26 y.o. female with below list of chronic medical conditions presents to the emergency department with left upper quadrant/gastric abdominal discomfort described as throbbing/sharp accompanied by dyspnea. Patient states that she's had this discomfort for "a while". For which she is received evaluation before including an ultrasound which revealed no acute pathology. Patient states she was seen at [facility] for the same again without any diagnosis rendered. Patient states her current pain score 7 out of 10.  Past Medical History:  Diagnosis Date  . Hypertension   . IBS (irritable bowel syndrome)   . Reflux   . Thyroid disease     Patient Active Problem List   Diagnosis Date Noted  . HTN (hypertension) 05/28/2017  . Hypothyroidism, adult 06/28/2016  . Extremity pain 03/26/2014    Past Surgical History:  Procedure Laterality Date  . UPPER ENDOSCOPY W/ SCLEROTHERAPY      Prior to Admission medications   Medication Sig Start Date End Date Taking? Authorizing Provider  Cholecalciferol (VITAMIN D3) 1000 units CAPS Take by mouth. 06/29/16   [provider]  DOXYCYCLINE HYCLATE PO Take by mouth.    [provider]  gabapentin (NEURONTIN) 300 MG capsule Take by mouth. 07/30/16 07/30/17  [provider]  hydrochlorothiazide (HYDRODIURIL) 25 MG tablet Take by mouth. 02/12/17   [provider]  levothyroxine (SYNTHROID, LEVOTHROID) 25 MCG tablet Take by mouth. 02/12/17   [provider]  metFORMIN (GLUCOPHAGE) 500 MG tablet Take 500 mg by mouth 2 (two) times daily with a meal.    [provider]  omeprazole (PRILOSEC) 20 MG  capsule Take 20 mg by mouth daily.    [provider]    Allergies Reglan [metoclopramide]  History reviewed. No pertinent family history.  Social History Social History  Substance Use Topics  . Smoking status: Current Some Day Smoker    Packs/day: 0.25    Types: Cigarettes  . Smokeless tobacco: Never Used  . Alcohol use No    Review of Systems Constitutional: No fever/chills Eyes: No visual changes. ENT: No sore throat. Cardiovascular: Denies chest pain. Respiratory: Denies shortness of breath. Gastrointestinal: positive for abdominal pain.  No nausea, no vomiting.  No diarrhea.  No constipation. Genitourinary: Negative for dysuria. Musculoskeletal: Negative for neck pain.  Negative for back pain. Integumentary: Negative for rash. Neurological: Negative for headaches, focal weakness or numbness.   ____________________________________________   PHYSICAL EXAM:  VITAL SIGNS: ED Triage Vitals  Enc Vitals Group     BP 06/22/17 2323 (!) 161/95     Pulse Rate 06/22/17 2323 85     Resp 06/22/17 2323 20     Temp --      Temp src --      SpO2 06/22/17 2323 98 %     Weight 06/22/17 2046 (!) 148.8 kg (328 lb)     Height 06/22/17 2046 1.575 m ( )     Head Circumference --      Peak Flow --      Pain Score 06/22/17 2046 9     Pain Loc --      Pain Edu? --    Constitutional: Alert and  oriented. Well appearing and in no acute distress. Eyes: Conjunctivae are normal.  Head: Atraumatic. Mouth/Throat: Mucous membranes are moist.  Oropharynx non-erythematous. Neck: No stridor.   Cardiovascular: Normal rate, regular rhythm. Good peripheral circulation. Grossly normal heart sounds. Respiratory: Normal respiratory effort.  No retractions. Lungs CTAB. Gastrointestinal: Left upper quadrant tenderness to palpation No distention.   Musculoskeletal: No lower extremity tenderness nor edema. No gross deformities of extremities. Neurologic:  Normal speech and language. No  gross focal neurologic deficits are appreciated.  Skin:  Skin is warm, dry and intact. No rash noted. Psychiatric: Mood and affect are normal. Speech and behavior are normal.  ____________________________________________   LABS (all labs ordered are listed, but only abnormal results are displayed)  Labs Reviewed  BASIC METABOLIC PANEL - Abnormal; Notable for the following:       Result Value   Glucose, Bld 145 (*)    All other components within normal limits  CBC - Abnormal; Notable for the following:    WBC 11.5 (*)    MCV 75.4 (*)    MCH 25.2 (*)    RDW 16.3 (*)    All other components within normal limits  TROPONIN I   ____________________________________________  EKG  ED ECG REPORT I, [place] Rondal Vandevelde, the attending physician, personally viewed and interpreted this ECG.   Date: 06/23/2017  EKG Time: 10:50 PM  Rate:90  Rhythm: Normal sinus rhythm  Axis: Normal  Intervals: Normal  ST&T Change: None  ____________________________________________  RADIOLOGY I, [place] N Labrenda Lasky, personally viewed and evaluated these images (plain radiographs) as part of my medical decision making, as well as reviewing the written report by the radiologist.  Dg Chest 2 View  Result Date: 06/22/2017 CLINICAL DATA:  Left-sided chest pain with short of breath EXAM: CHEST  2 VIEW COMPARISON:  None. FINDINGS: The heart size and mediastinal contours are within normal limits. Both lungs are clear. The visualized skeletal structures are unremarkable. IMPRESSION: No active cardiopulmonary disease. Electronically Signed   By: Jasmine PangKim  Fujinaga M.D.   On: 06/22/2017 21:11     Procedures   ____________________________________________   INITIAL IMPRESSION / ASSESSMENT AND PLAN / ED COURSE  Pertinent labs & imaging results that were available during my care of the patient were reviewed by me and considered in my medical decision making (see chart for details).  26 year old female presenting with  above stated history of physical exam. Patient left upper quadrant tenderness to palpation CT scan of the abdomen revealed no clear etiology for the patient's pain. I did inform the patient of the CT scan finding of mild hepatomegaly. Patient will be referred to gastroenterology for further outpatient evaluation.      ____________________________________________  FINAL CLINICAL IMPRESSION(S) / ED DIAGNOSES  Final diagnoses:  Left upper quadrant pain  Hepatomegaly     MEDICATIONS GIVEN DURING THIS VISIT:  Medications - No data to display   NEW OUTPATIENT MEDICATIONS STARTED DURING THIS VISIT:  New Prescriptions   No medications on file    Modified Medications   No medications on file    Discontinued Medications   No medications on file     Note:  This document was prepared using Dragon voice recognition software and may include unintentional dictation errors.    Darci CurrentBrown,  N, MD 06/23/17 [phone]

## 2017-06-22 NOTE — ED Triage Notes (Signed)
Patient with complaint of left side chest pain and shortness of breath that started about an hour prior to arrival.

## 2017-06-22 NOTE — ED Notes (Signed)
Dr. Brown at bedside

## 2017-06-23 ENCOUNTER — Emergency Department: Payer: 59

## 2017-06-23 ENCOUNTER — Encounter: Payer: Self-pay | Admitting: Radiology

## 2017-06-23 LAB — URINALYSIS, COMPLETE (UACMP) WITH MICROSCOPIC
BILIRUBIN URINE: NEGATIVE
GLUCOSE, UA: NEGATIVE mg/dL
Hgb urine dipstick: NEGATIVE
Ketones, ur: NEGATIVE mg/dL
Leukocytes, UA: NEGATIVE
NITRITE: NEGATIVE
PH: 5 (ref 5.0–8.0)
Protein, ur: 100 mg/dL — AB
SPECIFIC GRAVITY, URINE: 1.018 (ref 1.005–1.030)

## 2017-06-23 LAB — HEPATIC FUNCTION PANEL
ALK PHOS: 85 U/L (ref 38–126)
ALT: 20 U/L (ref 14–54)
AST: 26 U/L (ref 15–41)
Albumin: 3.3 g/dL — ABNORMAL LOW (ref 3.5–5.0)
Bilirubin, Direct: <0.1 mg/dL — ABNORMAL LOW (ref 0.1–0.5)
TOTAL PROTEIN: 7.9 g/dL (ref 6.5–8.1)
Total Bilirubin: 0.4 mg/dL (ref 0.3–1.2)

## 2017-06-23 LAB — FIBRIN DERIVATIVES D-DIMER (ARMC ONLY): FIBRIN DERIVATIVES D-DIMER (ARMC): 226.26 (ref 0.00–499.00)

## 2017-06-23 LAB — POCT PREGNANCY, URINE: Preg Test, Ur: NEGATIVE

## 2017-06-23 MED ORDER — IOPAMIDOL (ISOVUE-300) INJECTION 61%
125.0000 mL | Freq: Once | INTRAVENOUS | Status: AC | PRN
  Administered 2017-06-23: 125 mL via INTRAVENOUS

## 2017-06-23 NOTE — ED Notes (Signed)
Pt easily awakened when entered room and called name; pt rates pain 7/10

## 2017-06-23 NOTE — ED Notes (Signed)
Pt back from CT

## 2017-06-23 NOTE — ED Notes (Signed)
Pt reports while she was in CT scanner she began feeling very warm and felt like her throat was closing up; pt says she is feeling much better now; awake and alert; no difficulty swallowing

## 2017-07-01 ENCOUNTER — Encounter: Payer: Self-pay | Admitting: Dietician

## 2017-07-01 ENCOUNTER — Encounter: Payer: 59 | Attending: Student | Admitting: Dietician

## 2017-07-01 VITALS — Ht 62.0 in | Wt 329.0 lb

## 2017-07-01 DIAGNOSIS — IMO0001 Reserved for inherently not codable concepts without codable children: Secondary | ICD-10-CM

## 2017-07-01 DIAGNOSIS — I10 Essential (primary) hypertension: Secondary | ICD-10-CM | POA: Insufficient documentation

## 2017-07-01 DIAGNOSIS — Z6841 Body Mass Index (BMI) 40.0 and over, adult: Secondary | ICD-10-CM | POA: Diagnosis not present

## 2017-07-01 DIAGNOSIS — E669 Obesity, unspecified: Secondary | ICD-10-CM | POA: Insufficient documentation

## 2017-07-01 DIAGNOSIS — R7303 Prediabetes: Secondary | ICD-10-CM

## 2017-07-01 DIAGNOSIS — Z713 Dietary counseling and surveillance: Secondary | ICD-10-CM | POA: Insufficient documentation

## 2017-07-01 NOTE — Progress Notes (Signed)
Medical Nutrition Therapy: Visit start time: 1030  end time: 1120  Assessment:  Diagnosis: obesity Medical history changes: no changes per patient Psychosocial issues/ stress concerns: increased stress in past month  Current weight: 329.0lbs (reports 325lbs at MD office last week)  Height: 5'2" Medications, supplement changes: no changes per patient  Progress and evaluation: Patient reports several issues in the past month have limited her ability to make much dietary progress. Finances have made her unable to buy groceries. Mother-in-law has moved into the home. 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 She has decreased frequency of eating to often 1 meal a day, with no snacking, stating she has not been hungry or schedule has not allowed her to stop and eat. She has been working to decrease smoking as well as energy drinks. Size of meals seems to vary, some small sandwich meals, and some large meals such as 3 packs of ramen noodles or 2 fast food sandwiches.   Physical activity: increased daily walking at work, some up and down steps at home.   Dietary Intake:  Usual eating pattern includes 1-2 meals and 0 snacks per day. Dining out frequency: 8-10 meals per week, usually chicken.  Breakfast: usually none; a few cooked breakfast meals in past month, today ate [employer] sandwich Snack: none Lunch: usually none Snack: none Supper: 7-9pm often fast foods, sometimes small meals "4 for $4" Snack: none Beverages: some water, less monster drinks and energy drinks; some orange juice or sprite, lemonade  Nutrition Care Education: Topics covered: weight management Basic nutrition: basic food groups, appropriate nutrient balance, appropriate meal and snack schedule Weight control: determining reasonable weight goal, reasons for eating 3 times a day, factors affecting basal metabolism including thyroid function, long periods of time without food and large number of calories when eating meals,  physical activity and importance of fueling activity. Discussed importance of limiting liquid calories and sugar.  Diabetes prevention: appropriate meal and snack schedule, importance of controlling sugar intake, particularly from sugar-sweetened beverages.  Nutritional Diagnosis:  [place]-2.2 Altered nutrition-related laboratory As related to pre-diabetes.  As evidenced by lab report, history of hyperglycemia. [place]-3.3 Overweight/obesity As related to hypothyroidism, excess calories, inactivity.  As evidenced by BMI 60, patient report.  Intervention: Discussion, instruction as noted above.   Diet and activity changes are dependent on finances.    Reset goals with input from patient.   She requested follow-up in 2 months, will attempt email contact in 1 month.  Education Materials given:  Marland Kitchen Goals/ instructions   Learner/ who was taught:  . Patient   Level of understanding: Marland Kitchen Verbalizes/ demonstrates competency   Demonstrated degree of understanding via:   Teach back Learning barriers: . None  Willingness to learn/ readiness for change: . Eager, change in progress  Monitoring and Evaluation:  Dietary intake, exercise, BG control, and body weight      follow up: 09/02/17

## 2017-07-01 NOTE — Patient Instructions (Signed)
   Eat something at least 3 times a day. Try to include 3 food groups with each meal -- protein/ dairy + starch/ grain + vegetables and/or fruit.   Continue to decrease any drinks with sugar, each 4oz has about 3 tsp. Sugar and 60 calories. Increase water and sugar free flavored waters.  Continue to keep up daily walking and other movement. Add gym exercise as you are able.   Limit or avoid fast foods, work on eating more meals at home.   Great job decreasing energy drinks!

## 2017-07-11 ENCOUNTER — Encounter: Payer: Self-pay | Admitting: Emergency Medicine

## 2017-07-11 ENCOUNTER — Emergency Department
Admission: EM | Admit: 2017-07-11 | Discharge: 2017-07-11 | Disposition: A | Discharge status: Home / Self Care | Payer: 59 | Attending: Emergency Medicine | Admitting: Emergency Medicine

## 2017-07-11 DIAGNOSIS — F17210 Nicotine dependence, cigarettes, uncomplicated: Secondary | ICD-10-CM | POA: Insufficient documentation

## 2017-07-11 DIAGNOSIS — E039 Hypothyroidism, unspecified: Secondary | ICD-10-CM | POA: Insufficient documentation

## 2017-07-11 DIAGNOSIS — L02415 Cutaneous abscess of right lower limb: Secondary | ICD-10-CM | POA: Insufficient documentation

## 2017-07-11 DIAGNOSIS — I10 Essential (primary) hypertension: Secondary | ICD-10-CM | POA: Insufficient documentation

## 2017-07-11 DIAGNOSIS — R197 Diarrhea, unspecified: Secondary | ICD-10-CM | POA: Insufficient documentation

## 2017-07-11 DIAGNOSIS — L02416 Cutaneous abscess of left lower limb: Secondary | ICD-10-CM | POA: Insufficient documentation

## 2017-07-11 LAB — COMPREHENSIVE METABOLIC PANEL
ALK PHOS: 100 U/L (ref 38–126)
ALT: 24 U/L (ref 14–54)
AST: 24 U/L (ref 15–41)
Albumin: 3.3 g/dL — ABNORMAL LOW (ref 3.5–5.0)
Anion gap: 9 (ref 5–15)
BUN: 9 mg/dL (ref 6–20)
CO2: 26 mmol/L (ref 22–32)
CREATININE: 0.72 mg/dL (ref 0.44–1.00)
Calcium: 9 mg/dL (ref 8.9–10.3)
Chloride: 102 mmol/L (ref 101–111)
Glucose, Bld: 117 mg/dL — ABNORMAL HIGH (ref 65–99)
Potassium: 4.2 mmol/L (ref 3.5–5.1)
Sodium: 137 mmol/L (ref 135–145)
Total Bilirubin: 0.3 mg/dL (ref 0.3–1.2)
Total Protein: 8 g/dL (ref 6.5–8.1)

## 2017-07-11 LAB — URINALYSIS, COMPLETE (UACMP) WITH MICROSCOPIC
BILIRUBIN URINE: NEGATIVE
Bacteria, UA: NONE SEEN
Glucose, UA: NEGATIVE mg/dL
HGB URINE DIPSTICK: NEGATIVE
KETONES UR: NEGATIVE mg/dL
LEUKOCYTES UA: NEGATIVE
Nitrite: NEGATIVE
PROTEIN: 100 mg/dL — AB
Specific Gravity, Urine: 1.021 (ref 1.005–1.030)
pH: 5 (ref 5.0–8.0)

## 2017-07-11 LAB — CBC
HCT: 37.3 % (ref 35.0–47.0)
Hemoglobin: 12.5 g/dL (ref 12.0–16.0)
MCH: 25.7 pg — AB (ref 26.0–34.0)
MCHC: 33.6 g/dL (ref 32.0–36.0)
MCV: 76.7 fL — ABNORMAL LOW (ref 80.0–100.0)
PLATELETS: 221 10*3/uL (ref 150–440)
RBC: 4.86 MIL/uL (ref 3.80–5.20)
RDW: 16.3 % — ABNORMAL HIGH (ref 11.5–14.5)
WBC: 11.2 10*3/uL — AB (ref 3.6–11.0)

## 2017-07-11 LAB — LIPASE, BLOOD: Lipase: 18 U/L (ref 11–51)

## 2017-07-11 LAB — POCT PREGNANCY, URINE: PREG TEST UR: NEGATIVE

## 2017-07-11 MED ORDER — ONDANSETRON 4 MG PO TBDP: 4.0000 mg | ORAL_TABLET | Freq: Three times a day (TID) | ORAL | 0 refills | Status: DC | PRN

## 2017-07-11 MED ORDER — IBUPROFEN 800 MG PO TABS
800.0000 mg | ORAL_TABLET | Freq: Once | ORAL | Status: AC
  Administered 2017-07-11: 800 mg via ORAL
  Filled 2017-07-11: qty 1

## 2017-07-11 MED ORDER — CEPHALEXIN 500 MG PO CAPS: 500.0000 mg | ORAL_CAPSULE | Freq: Four times a day (QID) | ORAL | 0 refills | Status: AC

## 2017-07-11 MED ORDER — ONDANSETRON 4 MG PO TBDP
4.0000 mg | ORAL_TABLET | Freq: Once | ORAL | Status: AC
  Administered 2017-07-11: 4 mg via ORAL
  Filled 2017-07-11: qty 1

## 2017-07-11 MED ORDER — DICYCLOMINE HCL 20 MG PO TABS: 20.0000 mg | ORAL_TABLET | Freq: Three times a day (TID) | ORAL | 0 refills | Status: DC | PRN

## 2017-07-11 MED ORDER — CEPHALEXIN 500 MG PO CAPS
500.0000 mg | ORAL_CAPSULE | Freq: Once | ORAL | Status: AC
  Administered 2017-07-11: 500 mg via ORAL
  Filled 2017-07-11: qty 1

## 2017-07-11 NOTE — Discharge Instructions (Signed)
Please follow the instructions for the irritable bowel syndrome diet. You may take Bentyl to see if this improves her symptoms, and Zofran as for nausea or vomiting. Make an appointment with the GI doctor for further evaluation.  Please take the entire course of Keflex for your healing abscesses. Make an appointment with your primary care physician for further evaluation of your abscesses.  Return to the emergency department if you develop severe pain, fever, inability to keep down fluids, lightheadedness or fainting, or any other symptoms concerning to you.

## 2017-07-11 NOTE — ED Triage Notes (Signed)
Pt reports generalized abdominal pain and frequent diarrhea that has increased over the past several days. Pt reports history of IBS. Denies vomiting, reports nausea. Pt ambulatory to triage, no apparent distress noted.

## 2017-07-11 NOTE — ED Notes (Signed)
Pt ambulating without difficulty at time of discharge and reported understanding of home care, medications and follow up. Pt reported feeling safe walking to lobby. NAD noted at time of discharge.

## 2017-07-11 NOTE — ED Provider Notes (Signed)
[facility] Emergency Department Provider Note  ____________________________________________  Time seen: Approximately 11:55 AM  I have reviewed the triage vital signs and the nursing notes.   HISTORY  Chief Complaint Abdominal Pain and Diarrhea    HPI Katrina Meyers is a 26 y.o. female with a history of hypertension, morbid obesity, GERD and IBS presenting with diffuse abdominal pain and cramping with frequent stooling. The patient reportsthat she has chronicfrequent stooling, but over the last several days has noted that she hurts stool is slightly more loose with some yellow mucus and that she is having multiple episodes daily. This is associated with a nonfocal diffuse cramping"all over." She has some mild associated nausea but no vomiting; on my entry into her room, she is eating crackers and drinking ginger ale without any issues. She states "I come to the emergency department all the time" for abdominal pain, but states "I keep forgetting to call the GI doctor." She denies any fever, chills, dysuria. She is nonpregnant. She has tried Linzess in the past without any improvement. She is additionally concerned about recurrent inner thigh abscesses that she has which hurt when her thighs rub together, but denies any open wounds, erythema, swelling, or systemic symptoms today.   Past Medical History:  Diagnosis Date  . Hypertension   . IBS (irritable bowel syndrome)   . Reflux   . Thyroid disease     Patient Active Problem List   Diagnosis Date Noted  . HTN (hypertension) 05/28/2017  . Hypothyroidism, adult 06/28/2016  . Extremity pain 03/26/2014    Past Surgical History:  Procedure Laterality Date  . UPPER ENDOSCOPY W/ SCLEROTHERAPY      Current Outpatient Rx  . Order #: [id number] Class: Print  . Order #: [id number] Class: Historical Med  . Order #: [id number] Class: Print  . Order #: [id number] Class: Historical Med  . Order #: [id number] Class:  Historical Med  . Order #: [id number] Class: Historical Med  . Order #: [id number] Class: Historical Med  . Order #: [id number] Class: Historical Med  . Order #: [id number] Class: Historical Med  . Order #: [id number] Class: Print    Allergies Reglan [metoclopramide]  No family history on file.  Social History Social History  Substance Use Topics  . Smoking status: Current Some Day Smoker    Packs/day: 0.25    Types: Cigarettes  . Smokeless tobacco: Never Used     Comment: has called 1-800-quit now and is decreasing smoking  . Alcohol use No    Review of Systems Constitutional: No fever/chills.no lightheadedness or syncope. Eyes: No visual changes. ENT: No sore throat. No congestion or rhinorrhea. Cardiovascular: Denies chest pain. Denies palpitations. Respiratory: Denies shortness of breath.  No cough. Gastrointestinal: Positive diffuse nonfocal abdominal pain.  No nausea, no vomiting.  Positive loose stool with yellow mucus, nonbloody.  No constipation. Genitourinary: Negative for dysuria. Musculoskeletal: Negative for back pain. Skin: Positive for recurrent abscess. Neurological: Negative for headaches. No focal numbness, tingling or weakness.     ____________________________________________   PHYSICAL EXAM:  VITAL SIGNS: ED Triage Vitals  Enc Vitals Group     BP 07/11/17 1019 (!) 155/97     Pulse Rate 07/11/17 1019 (!) 104     Resp 07/11/17 1019 20     Temp 07/11/17 1019 98.8 F (37.1 C)     Temp Source 07/11/17 1019 Oral     SpO2 07/11/17 1019 100 %     Weight 07/11/17 1021 (!) 327 lb (148.3 kg)     Height 07/11/17  1021  (1.575 m)     Head Circumference --      Peak Flow --      Pain Score 07/11/17 1033 10     Pain Loc --      Pain Edu? --      Excl. in GC? --     Constitutional: Alert and oriented. Chronically ill appearing and in no acute distress. Answers questions appropriately. Eyes: Conjunctivae are normal.  EOMI. No scleral icterus. Head:  Atraumatic. Nose: No congestion/rhinnorhea. Mouth/Throat: Mucous membranes are moist.  Neck: No stridor.  Supple.  No JVD. No meningismus. Cardiovascular: Normal rate, regular rhythm. No murmurs, rubs or gallops.  Respiratory: Normal respiratory effort.  No accessory muscle use or retractions. Lungs CTAB.  No wheezes, rales or ronchi. Gastrointestinal: morbidly obeseSoft, and nondistended.  Tender to palpation in everyarea of the abdomen that is palpated.No guarding or rebound.  No peritoneal signs. Musculoskeletal: No LE edema.  Neurologic:  A&Ox3.  Speech is clear.  Face and smile are symmetric.  EOMI.  Moves all extremities well. Skin:  Skin is warm, dry. The patient has significant scarring on the inner thighs. She has 21 cm areas of healing abscess on the left intermittent thigh without any fluctuance, erythema, significant tenderness or purulent drainage. Psychiatric: Mood and affect are normal. Speech and behavior are normal.  Normal judgement.  ____________________________________________   LABS (all labs ordered are listed, but only abnormal results are displayed)  Labs Reviewed  COMPREHENSIVE METABOLIC PANEL - Abnormal; Notable for the following:       Result Value   Glucose, Bld 117 (*)    Albumin 3.3 (*)    All other components within normal limits  CBC - Abnormal; Notable for the following:    WBC 11.2 (*)    MCV 76.7 (*)    MCH 25.7 (*)    RDW 16.3 (*)    All other components within normal limits  URINALYSIS, COMPLETE (UACMP) WITH MICROSCOPIC - Abnormal; Notable for the following:    Color, Urine YELLOW (*)    APPearance CLOUDY (*)    Protein, ur 100 (*)    Squamous Epithelial / LPF 0-5 (*)    All other components within normal limits  LIPASE, BLOOD  POCT PREGNANCY, URINE   ____________________________________________  EKG  Not indicated ____________________________________________  RADIOLOGY  No results  found.  ____________________________________________   PROCEDURES  Procedure(s) performed: None  Procedures  Critical Care performed: No ____________________________________________   INITIAL IMPRESSION / ASSESSMENT AND PLAN / ED COURSE  Pertinent labs & imaging results that were available during my care of the patient were reviewed by me and considered in my medical decision making (see chart for details).  26 y.o. female with recurrent abdominal pain, IBS, and recurrent abscesses presenting with abdominal pain, increased stooling, and thigh abscesses that are healing. Overall, the patient is hypertensive and does have a history of hypertension; I have asked her to follow up with her primary care physician for this. She has no evidence of significant dehydration with moist mucous membranesand reassuring laboratory studies. Her urinalysis does not show infection and she is nonpregnant. Overall, this may be an acute flare of her chronic IBS, or a viral or foodborne GI illness. I'll try to give the patient Bentyl to see if this can help her symptoms, but I'm reticent to start her on high-dose antidiarrheal medication until she is evaluated by GI physician. An acute intra-abdominal surgical pathology such as appendicitis, acute gallbladder disease, diverticulitis,  intra-abdominal abscess, or all very unlikelyand no imaging is warranted at this time. She just underwent CT examination 9/9 with no acute findings and hepatomegaly of unknown etiology.The patient does have some healing abscesses in the thigh but no evidence of fluctuance or abscess that needs incision and drainage today. I will put her on a ten-day course of Keflex for recurrent abscess but have indicated to her that it is vital to follow up with her primary care physician for the symptoms. Return precautions as well as follow-up instructions were discussed. The patient and her wife were able to have their questions answered by  me.  ____________________________________________  FINAL CLINICAL IMPRESSION(S) / ED DIAGNOSES  Final diagnoses:  Diarrhea, unspecified type  Multiple abscesses of both legs  Essential hypertension         NEW MEDICATIONS STARTED DURING THIS VISIT:  New Prescriptions   CEPHALEXIN (KEFLEX) 500 MG CAPSULE    Take 1 capsule (500 mg total) by mouth 4 (four) times daily.   DICYCLOMINE (BENTYL) 20 MG TABLET    Take 1 tablet (20 mg total) by mouth 3 (three) times daily as needed for spasms.   ONDANSETRON (ZOFRAN ODT) 4 MG DISINTEGRATING TABLET    Take 1 tablet (4 mg total) by mouth every 8 (eight) hours as needed for nausea or vomiting.      Rockne Menghini, MD 07/11/17 [phone]

## 2017-07-18 ENCOUNTER — Ambulatory Visit: Payer: 59 | Admitting: Gastroenterology

## 2017-07-23 ENCOUNTER — Ambulatory Visit (INDEPENDENT_AMBULATORY_CARE_PROVIDER_SITE_OTHER): Payer: 59 | Admitting: Gastroenterology

## 2017-07-23 ENCOUNTER — Other Ambulatory Visit: Payer: Self-pay

## 2017-07-23 ENCOUNTER — Encounter: Payer: Self-pay | Admitting: Gastroenterology

## 2017-07-23 ENCOUNTER — Ambulatory Visit: Payer: 59 | Admitting: Gastroenterology

## 2017-07-23 VITALS — BP 148/86 | HR 80 | Temp 99.0°F | Ht 62.0 in | Wt 325.2 lb

## 2017-07-23 DIAGNOSIS — R197 Diarrhea, unspecified: Secondary | ICD-10-CM

## 2017-07-23 DIAGNOSIS — M549 Dorsalgia, unspecified: Secondary | ICD-10-CM | POA: Diagnosis not present

## 2017-07-23 NOTE — Progress Notes (Signed)
Katrina Mood MD, MRCP(U.K) [address]  [address]  [place], [place] [address]  Main: [phone]  Fax: [phone]   Gastroenterology Consultation  Referring Provider:     Carren Rang, PA-C Primary Care Physician:  Katrina Rang, PA-C Primary Gastroenterologist:  Dr. Wyline Meyers  Reason for Consultation:     IBS        HPI:   Katrina Meyers is a 26 y.o. y/o female referred for consultation & management  by Dr. Montez Meyers, Katrina Heck, PA-C.     She presented to the ER on 07/11/17 for abdominal pain, cramping and increased frequency of bowel movements. Hb 11.2, MCV 76.7 .Hb was 12.7 4 months back . 100 protein in her urine. 06/23/17- Ct abdomen and pelvis - Hepatomegaly   Abdominal pain: Onset: "always had stomach issues", got worse since 2012 - more problematic since 08/2016  Site :Sides of her abdomen , RLQ, very day , each episode lasts 20-30 mins each time. Can occur when she turns her body .  Radiation: Localized  Nature of pain: Gripping spasm, feels like someone is punching her  Aggravating factors: movement , eating makes it worse , pain occurs after about 20 mins , lasts for about 45 mins to an hour, cam have pain in her upper abdomen as well.  Relieving factors :wait , not eating  Weight loss: gained weight - has hypothyroidism  NSAID use: tylenol PPI use :omeprazole- does not take it everyday - was given in 2014  Gall bladder surgery: intact  Frequency of bowel movements: last 2 weeks 3-50 times a day  Change in bowel movements: yes  Relief with bowel movements: yes and no  Gas/Bloating/Abdominal distension: yes.  Consumes "lemonade poweder" 3 times a week. Consumes 32 oz lemonade every other day . No chewing gum . Does not take her metformin as often as she is prescribed.     Past Medical History:  Diagnosis Date  . Hypertension   . IBS (irritable bowel syndrome)   . Reflux   . Thyroid disease     Past Surgical History:  Procedure Laterality Date    . UPPER ENDOSCOPY W/ SCLEROTHERAPY      Prior to Admission medications   Medication Sig Start Date End Date Taking? Authorizing Provider  gabapentin (NEURONTIN) 300 MG capsule Take by mouth. 07/30/16 07/30/17 Yes [provider]  hydrochlorothiazide (HYDRODIURIL) 25 MG tablet Take by mouth. 02/12/17  Yes [provider]  levothyroxine (SYNTHROID, LEVOTHROID) 25 MCG tablet Take by mouth. 02/12/17  Yes [provider]  metFORMIN (GLUCOPHAGE) 500 MG tablet Take 500 mg by mouth 2 (two) times daily with a meal.   Yes [provider]  omeprazole (PRILOSEC) 20 MG capsule Take 20 mg by mouth daily.   Yes [provider]    No family history on file.   Social History  Substance Use Topics  . Smoking status: Current Some Day Smoker    Packs/day: 0.25    Types: Cigarettes  . Smokeless tobacco: Never Used     Comment: has called 1-800-quit now and is decreasing smoking  . Alcohol use No    Allergies as of 07/23/2017 - Review Complete 07/23/2017  Allergen Reaction Noted  . Reglan [metoclopramide] Other (See Comments) 02/08/2014    Review of Systems:    All systems reviewed and negative except where noted in HPI.   Physical Exam:  BP (!) 148/86   Pulse 80   Temp 99 F (37.2 C) (Oral)  Ht  (1.575 m)   Wt (!) 325 lb 3.2 oz (147.5 kg)   BMI 59.48 kg/m  No LMP recorded. Patient is not currently having periods (Reason: Irregular Periods). Psych:  Alert and cooperative. Normal Meyers and affect. General:   Alert,  Well-developed, well-nourished, pleasant and cooperative in NAD Head:  Normocephalic and atraumatic. Eyes:  Sclera clear, no icterus.   Conjunctiva pink. Ears:  Normal auditory acuity. Nose:  No deformity, discharge, or lesions. Mouth:  No deformity or lesions,oropharynx pink & moist. Neck:  Supple; no masses or thyromegaly. Lungs:  Respirations even and unlabored.  Clear throughout to auscultation.   No wheezes, crackles, or  rhonchi. No acute distress. Heart:  Regular rate and rhythm; no murmurs, clicks, rubs, or gallops. Abdomen:  Normal bowel sounds.  No bruits.  Soft, non-tender and non-distended without masses, hepatosplenomegaly or hernias noted.  No guarding or rebound tenderness.     She has tenderness over the lower lumbar b/l paraspinal muscles Neurologic:  Alert and oriented x3;  grossly normal neurologically. Skin:  Intact without significant lesions or rashes. No jaundice. Lymph Nodes:  No significant cervical adenopathy. Psych:  Alert and cooperative. Normal Meyers and affect.  Imaging Studies: No results found.  Assessment and Plan:   Katrina Meyers is a 26 y.o. y/o female has been referred for abdominal pain . On examination her pain is mostly in her back which radiates to her flanks. Palpation overher paraspinal lumbar muscles replecates the pain suggestive of musculoskeletal etiology. I suggested she needs to lose weight and see a Physiotherapist. She does have some post prandial pain for which I will get a GB usg to r/o gall stones. . She also has diarrhea , will r/o infection , advised to stop all artificial sugars and if no better will need a colonoscopy.   Follow up in 4 weeks.   Dr Katrina Mood MD,MRCP(U.K)

## 2017-07-25 ENCOUNTER — Encounter: Payer: Self-pay | Admitting: Gastroenterology

## 2017-07-26 LAB — CBC WITH DIFFERENTIAL/PLATELET
Basophils Absolute: 0 10*3/uL (ref 0.0–0.2)
Basos: 0 %
EOS (ABSOLUTE): 0.2 10*3/uL (ref 0.0–0.4)
EOS: 2 %
HEMATOCRIT: 37.2 % (ref 34.0–46.6)
Hemoglobin: 12.2 g/dL (ref 11.1–15.9)
IMMATURE GRANS (ABS): 0 10*3/uL (ref 0.0–0.1)
IMMATURE GRANULOCYTES: 0 %
LYMPHS: 27 %
Lymphocytes Absolute: 2.4 10*3/uL (ref 0.7–3.1)
MCH: 24.9 pg — ABNORMAL LOW (ref 26.6–33.0)
MCHC: 32.8 g/dL (ref 31.5–35.7)
MCV: 76 fL — ABNORMAL LOW (ref 79–97)
MONOCYTES: 8 %
MONOS ABS: 0.7 10*3/uL (ref 0.1–0.9)
NEUTROS PCT: 63 %
Neutrophils Absolute: 5.7 10*3/uL (ref 1.4–7.0)
Platelets: 281 10*3/uL (ref 150–379)
RBC: 4.89 x10E6/uL (ref 3.77–5.28)
RDW: 15.1 % (ref 12.3–15.4)
WBC: 9 10*3/uL (ref 3.4–10.8)

## 2017-07-26 LAB — GI PROFILE, STOOL, PCR
ASTROVIRUS: NOT DETECTED
Adenovirus F 40/41: NOT DETECTED
C difficile toxin A/B: NOT DETECTED
CYCLOSPORA CAYETANENSIS: NOT DETECTED
Campylobacter: DETECTED — AB
Cryptosporidium: NOT DETECTED
ENTAMOEBA HISTOLYTICA: NOT DETECTED
Enteroaggregative E coli: NOT DETECTED
Enteropathogenic E coli: NOT DETECTED
Enterotoxigenic E coli: NOT DETECTED
Giardia lamblia: NOT DETECTED
Norovirus GI/GII: NOT DETECTED
PLESIOMONAS SHIGELLOIDES: NOT DETECTED
Rotavirus A: NOT DETECTED
SAPOVIRUS: NOT DETECTED
SHIGELLA/ENTEROINVASIVE E COLI: NOT DETECTED
Salmonella: NOT DETECTED
Shiga-toxin-producing E coli: NOT DETECTED
VIBRIO CHOLERAE: NOT DETECTED
VIBRIO: NOT DETECTED
Yersinia enterocolitica: NOT DETECTED

## 2017-07-26 LAB — IRON,TIBC AND FERRITIN PANEL
Ferritin: 69 ng/mL (ref 15–150)
IRON: 49 ug/dL (ref 27–159)
Iron Saturation: 15 % (ref 15–55)
Total Iron Binding Capacity: 323 ug/dL (ref 250–450)
UIBC: 274 ug/dL (ref 131–425)

## 2017-07-26 LAB — CELIAC PANEL 10
Antigliadin Abs, IgA: 3 units (ref 0–19)
ENDOMYSIAL IGA: NEGATIVE
Gliadin IgG: 3 units (ref 0–19)
IGA/IMMUNOGLOBULIN A, SERUM: 103 mg/dL (ref 87–352)
Transglutaminase IgA: <2 U/mL (ref 0–3)

## 2017-07-26 LAB — C-REACTIVE PROTEIN: CRP: 13.9 mg/L — AB (ref 0.0–4.9)

## 2017-07-30 ENCOUNTER — Telehealth: Payer: Self-pay

## 2017-07-30 DIAGNOSIS — A499 Bacterial infection, unspecified: Secondary | ICD-10-CM

## 2017-07-30 MED ORDER — AZITHROMYCIN 500 MG PO TABS: 500.0000 mg | ORAL_TABLET | Freq: Every day | ORAL | 0 refills | Status: AC

## 2017-07-30 NOTE — Telephone Encounter (Signed)
Advised patient of results per Dr. Tobi Bastos.   Pt states she is having diarrhea. Sent Rx to pharmacy.

## 2017-08-20 ENCOUNTER — Ambulatory Visit: Payer: 59 | Admitting: Gastroenterology

## 2017-09-02 ENCOUNTER — Ambulatory Visit: Payer: 59 | Admitting: Dietician

## 2017-09-09 ENCOUNTER — Emergency Department
Admission: EM | Admit: 2017-09-09 | Discharge: 2017-09-09 | Disposition: A | Discharge status: Home / Self Care | Payer: 59 | Attending: Emergency Medicine | Admitting: Emergency Medicine

## 2017-09-09 ENCOUNTER — Emergency Department: Payer: 59

## 2017-09-09 ENCOUNTER — Encounter: Payer: Self-pay | Admitting: Intensive Care

## 2017-09-09 DIAGNOSIS — J441 Chronic obstructive pulmonary disease with (acute) exacerbation: Secondary | ICD-10-CM | POA: Diagnosis not present

## 2017-09-09 DIAGNOSIS — I10 Essential (primary) hypertension: Secondary | ICD-10-CM | POA: Diagnosis not present

## 2017-09-09 DIAGNOSIS — F17210 Nicotine dependence, cigarettes, uncomplicated: Secondary | ICD-10-CM | POA: Diagnosis not present

## 2017-09-09 DIAGNOSIS — Z7984 Long term (current) use of oral hypoglycemic drugs: Secondary | ICD-10-CM | POA: Diagnosis not present

## 2017-09-09 DIAGNOSIS — E039 Hypothyroidism, unspecified: Secondary | ICD-10-CM | POA: Insufficient documentation

## 2017-09-09 DIAGNOSIS — F17218 Nicotine dependence, cigarettes, with other nicotine-induced disorders: Secondary | ICD-10-CM | POA: Insufficient documentation

## 2017-09-09 DIAGNOSIS — R079 Chest pain, unspecified: Secondary | ICD-10-CM | POA: Diagnosis present

## 2017-09-09 DIAGNOSIS — R0789 Other chest pain: Secondary | ICD-10-CM | POA: Diagnosis not present

## 2017-09-09 DIAGNOSIS — Z79899 Other long term (current) drug therapy: Secondary | ICD-10-CM | POA: Diagnosis not present

## 2017-09-09 HISTORY — DX: Prediabetes: R73.03

## 2017-09-09 LAB — CBC
HEMATOCRIT: 37.9 % (ref 35.0–47.0)
HEMOGLOBIN: 12.5 g/dL (ref 12.0–16.0)
MCH: 25.6 pg — ABNORMAL LOW (ref 26.0–34.0)
MCHC: 33.1 g/dL (ref 32.0–36.0)
MCV: 77.1 fL — AB (ref 80.0–100.0)
Platelets: 243 10*3/uL (ref 150–440)
RBC: 4.91 MIL/uL (ref 3.80–5.20)
RDW: 17 % — ABNORMAL HIGH (ref 11.5–14.5)
WBC: 9.1 10*3/uL (ref 3.6–11.0)

## 2017-09-09 LAB — BASIC METABOLIC PANEL
ANION GAP: 9 (ref 5–15)
BUN: 11 mg/dL (ref 6–20)
CHLORIDE: 100 mmol/L — AB (ref 101–111)
CO2: 26 mmol/L (ref 22–32)
Calcium: 8.7 mg/dL — ABNORMAL LOW (ref 8.9–10.3)
Creatinine, Ser: 0.81 mg/dL (ref 0.44–1.00)
GFR calc Af Amer: >60 mL/min (ref >60)
GFR calc non Af Amer: >60 mL/min (ref >60)
Glucose, Bld: 143 mg/dL — ABNORMAL HIGH (ref 65–99)
POTASSIUM: 4 mmol/L (ref 3.5–5.1)
Sodium: 135 mmol/L (ref 135–145)

## 2017-09-09 LAB — TROPONIN I: Troponin I: <0.03 ng/mL (ref <0.03)

## 2017-09-09 MED ORDER — IBUPROFEN 600 MG PO TABS: 600.0000 mg | ORAL_TABLET | Freq: Three times a day (TID) | ORAL | 0 refills | Status: DC | PRN

## 2017-09-09 MED ORDER — SPACER/AERO CHAMBER MOUTHPIECE MISC: 1.0000 [IU] | 0 refills | Status: DC | PRN

## 2017-09-09 MED ORDER — BENZONATATE 100 MG PO CAPS: 100.0000 mg | ORAL_CAPSULE | Freq: Four times a day (QID) | ORAL | 0 refills | Status: DC | PRN

## 2017-09-09 MED ORDER — PREDNISONE 10 MG PO TABS: 50.0000 mg | ORAL_TABLET | Freq: Every day | ORAL | 0 refills | Status: AC

## 2017-09-09 MED ORDER — ALBUTEROL SULFATE HFA 108 (90 BASE) MCG/ACT IN AERS: 2.0000 | INHALATION_SPRAY | Freq: Four times a day (QID) | RESPIRATORY_TRACT | 0 refills | Status: DC | PRN

## 2017-09-09 NOTE — ED Provider Notes (Signed)
[facility] Emergency Department Provider Note  ____________________________________________   First MD Initiated Contact with Patient 09/09/17 1214     (approximate)  I have reviewed the triage vital signs and the nursing notes.   HISTORY  Chief Complaint Chest Pain   HPI Katrina Meyers is a 26 y.o. female with a past medical history of chronic bronchitis who comes to the emergency department with 2 days of insidious onset slowly progressive chest pain.  Her pain is sharp and aching in her right greater than left upper chest.  It is moderate in severity.  Worsened with cough improved when not coughing.  She has used albuterol with minimal relief.  She does continue to smoke.  She denies fevers or chills.  Her symptoms are nonexertional.  She has no history of deep vein thrombosis or pulmonary embolism.  She has had no leg swelling.  No hemoptysis.  Past Medical History:  Diagnosis Date  . Borderline diabetic   . Hypertension   . IBS (irritable bowel syndrome)   . Reflux   . Thyroid disease     Patient Active Problem List   Diagnosis Date Noted  . Class 3 severe obesity due to excess calories with serious comorbidity and body mass index (BMI) of 50.0 to 59.9 in adult (HCC) 09/10/2017  . Gastroesophageal reflux disease without esophagitis 09/10/2017  . Irritable bowel syndrome 09/10/2017  . Prediabetes 09/10/2017  . HTN (hypertension) 05/28/2017  . Abnormal Pap smear of cervix 10/15/2016  . Hypothyroidism, adult 06/28/2016  . Extremity pain 03/26/2014    Past Surgical History:  Procedure Laterality Date  . UPPER ENDOSCOPY W/ SCLEROTHERAPY      Prior to Admission medications   Medication Sig Start Date End Date Taking? Authorizing Provider  albuterol (PROVENTIL HFA;VENTOLIN HFA) 108 (90 Base) MCG/ACT inhaler Inhale 2 puffs into the lungs every 6 (six) hours as needed for wheezing or shortness of breath. 09/09/17   Merrily Brittle, MD    benzonatate (TESSALON PERLES) 100 MG capsule Take 1 capsule (100 mg total) by mouth every 6 (six) hours as needed for cough. 09/09/17 09/09/18  Merrily Brittle, MD  dicyclomine (BENTYL) 20 MG tablet TK 1 T PO TID PRN FOR SPASMS 07/11/17   [provider]  hydrochlorothiazide (HYDRODIURIL) 25 MG tablet Take by mouth. 02/12/17   [provider]  ibuprofen (ADVIL,MOTRIN) 600 MG tablet Take 1 tablet (600 mg total) by mouth every 8 (eight) hours as needed. 09/09/17   Merrily Brittle, MD  levothyroxine (SYNTHROID, LEVOTHROID) 25 MCG tablet Take by mouth. 02/12/17   [provider]  metFORMIN (GLUCOPHAGE) 500 MG tablet Take 500 mg by mouth 2 (two) times daily with a meal.    [provider]  naproxen (NAPROSYN) 500 MG tablet Take by mouth.    [provider]  omeprazole (PRILOSEC) 20 MG capsule Take 20 mg by mouth daily.    [provider]  ondansetron (ZOFRAN-ODT) 4 MG disintegrating tablet DIS ONE T PO Q 8 H PRN NV 07/11/17   [provider]  predniSONE (DELTASONE) 10 MG tablet Take 5 tablets (50 mg total) by mouth daily for 5 days. 09/09/17 09/14/17  Merrily Brittle, MD  Spacer/Aero Chamber Mouthpiece MISC 1 Units by Does not apply route every 4 (four) hours as needed (wheezing). 09/09/17   Merrily Brittle, MD    Allergies Reglan [metoclopramide]  History reviewed. No pertinent family history.  Social History Social History   Tobacco Use  . Smoking status:  Current Every Day Smoker    Packs/day: 0.25    Types: Cigarettes  . Smokeless tobacco: Never Used  . Tobacco comment: has called 1-800-quit now and is decreasing smoking  Substance Use Topics  . Alcohol use: No  . Drug use: No    Review of Systems Constitutional: No fever/chills Eyes: No visual changes. ENT: No sore throat. Cardiovascular: Positive for chest pain. Respiratory: Positive for shortness of breath. Gastrointestinal: No abdominal pain.  No nausea, no vomiting.   No diarrhea.  No constipation. Genitourinary: Negative for dysuria. Musculoskeletal: Negative for back pain. Skin: Negative for rash. Neurological: Negative for headaches, focal weakness or numbness.   ____________________________________________   PHYSICAL EXAM:  VITAL SIGNS: ED Triage Vitals  Enc Vitals Group     BP 09/09/17 1047 (!) 149/96     Pulse Rate 09/09/17 1047 87     Resp 09/09/17 1047 18     Temp 09/09/17 1047 98.5 F (36.9 C)     Temp Source 09/09/17 1047 Oral     SpO2 09/09/17 1047 99 %     Weight 09/09/17 1047 (!) 329 lb (149.2 kg)     Height 09/09/17 1047 5\' 3"  (1.6 m)     Head Circumference --      Peak Flow --      Pain Score 09/09/17 1057 7     Pain Loc --      Pain Edu? --      Excl. in GC? --     Constitutional: Alert and oriented x4 morbidly obese well-appearing nontoxic no diaphoresis speaks full clear sentences Eyes: PERRL EOMI. Head: Atraumatic. Nose: No congestion/rhinnorhea. Mouth/Throat: No trismus Neck: No stridor.   Cardiovascular: Normal rate, regular rhythm. Grossly normal heart sounds.  Good peripheral circulation. Respiratory: Slightly increased respiratory effort with expiratory wheezes in all fields although moving good air Gastrointestinal: Soft nontender Musculoskeletal: No lower extremity edema legs are equal in size Neurologic:  Normal speech and language. No gross focal neurologic deficits are appreciated. Skin:  Skin is warm, dry and intact. No rash noted. Psychiatric: Mood and affect are normal. Speech and behavior are normal.    ____________________________________________   DIFFERENTIAL includes but not limited to  Pulmonary embolism, pneumothorax, pneumonia, COPD exacerbation or viral syndrome ____________________________________________   LABS (all labs ordered are listed, but only abnormal results are displayed)  Labs Reviewed  BASIC METABOLIC PANEL - Abnormal; Notable for the following components:      Result  Value   Chloride 100 (*)    Glucose, Bld 143 (*)    Calcium 8.7 (*)    All other components within normal limits  CBC - Abnormal; Notable for the following components:   MCV 77.1 (*)    MCH 25.6 (*)    RDW 17.0 (*)    All other components within normal limits  TROPONIN I    Blood work reviewed by me with no acute disease __________________________________________  EKG  ED ECG REPORT I, Merrily BrittleNeil Yvonne Petite, the attending physician, personally viewed and interpreted this ECG.  Date: 09/09/2017 EKG Time:  Rate: 87 Rhythm: normal sinus rhythm QRS Axis: normal Intervals: normal ST/T Wave abnormalities: normal Narrative Interpretation: no evidence of acute ischemia  ____________________________________________  RADIOLOGY  Chest x-ray reviewed by me consistent with bronchitic changes ____________________________________________   PROCEDURES  Procedure(s) performed: no  Procedures  Critical Care performed: no  Observation: no ____________________________________________   INITIAL IMPRESSION / ASSESSMENT AND PLAN / ED COURSE  Pertinent labs & imaging results that  were available during my care of the patient were reviewed by me and considered in my medical decision making (see chart for details).  The patient arrives with somewhat wheezy lungs and atypical chest pain.  She is PERC negative. She is somewhat wheezy so she is given a breathing treatment and steroids. She has follow-up with her primary care physician in 2 days. At this point she is medically stable for outpatient management verbalizes understanding and agreement with the plan.      ____________________________________________   FINAL CLINICAL IMPRESSION(S) / ED DIAGNOSES  Final diagnoses:  COPD exacerbation (HCC)  Class 3 severe obesity without serious comorbidity in adult, unspecified BMI, unspecified obesity type (HCC)  Chest wall pain  Cigarette nicotine dependence with other nicotine-induced  disorder      NEW MEDICATIONS STARTED DURING THIS VISIT:  This SmartLink is deprecated. Use AVSMEDLIST instead to display the medication list for a patient.   Note:  This document was prepared using Dragon voice recognition software and may include unintentional dictation errors.     Merrily Brittleifenbark, Lekita Kerekes, MD 09/11/17 1626

## 2017-09-09 NOTE — Discharge Instructions (Signed)
Please take all of your steroids as prescribed and keep your appointment to follow-up with your primary care physician in 2 days as already scheduled.  Please stop smoking because it will only make your COPD worse and worse.  Return to the emergency department for any concerns.  It was a pleasure to take care of you today, and thank you for coming to our emergency department.  If you have any questions or concerns before leaving please ask the nurse to grab me and I'm more than happy to go through your aftercare instructions again.  If you were prescribed any opioid pain medication today such as Norco, Vicodin, Percocet, morphine, hydrocodone, or oxycodone please make sure you do not drive when you are taking this medication as it can alter your ability to drive safely.  If you have any concerns once you are home that you are not improving or are in fact getting worse before you can make it to your follow-up appointment, please do not hesitate to call 911 and come back for further evaluation.  Telvin Reinders, MD  Results for orders plMerrily Brittleaced or performed during the hospital encounter of 09/09/17  Basic metabolic panel  Result Value Ref Range   Sodium 135 135 - 145 mmol/L   Potassium 4.0 3.5 - 5.1 mmol/L   Chloride 100 (L) 101 - 111 mmol/L   CO2 26 22 - 32 mmol/L   Glucose, Bld 143 (H) 65 - 99 mg/dL   BUN 11 6 - 20 mg/dL   Creatinine, Ser 1.610.81 0.44 - 1.00 mg/dL   Calcium 8.7 (L) 8.9 - 10.3 mg/dL   GFR calc non Af Amer >60 >60 mL/min   GFR calc Af Amer >60 >60 mL/min   Anion gap 9 5 - 15  CBC  Result Value Ref Range   WBC 9.1 3.6 - 11.0 K/uL   RBC 4.91 3.80 - 5.20 MIL/uL   Hemoglobin 12.5 12.0 - 16.0 g/dL   HCT 09.637.9 04.535.0 - 40.947.0 %   MCV 77.1 (L) 80.0 - 100.0 fL   MCH 25.6 (L) 26.0 - 34.0 pg   MCHC 33.1 32.0 - 36.0 g/dL   RDW 81.117.0 (H) 91.411.5 - 78.214.5 %   Platelets 243 150 - 440 K/uL  Troponin I  Result Value Ref Range   Troponin I <0.03 <0.03 ng/mL   Dg Chest 2 View  Result Date:  09/09/2017 CLINICAL DATA:  Chest pain, shortness of Breath EXAM: CHEST  2 VIEW COMPARISON:  06/22/2017 FINDINGS: Peribronchial thickening. Heart and mediastinal contours are within normal limits. No focal opacities or effusions. No acute bony abnormality. IMPRESSION: Bronchitic changes. Electronically Signed   By: Charlett NoseKevin  Dover M.D.   On: 09/09/2017 12:06

## 2017-09-09 NOTE — ED Triage Notes (Signed)
Patient presents with back pain that started last night and L sided throbbing chest pain and L arm pain started around 1000 am. Denies N/V

## 2017-09-10 DIAGNOSIS — Z6841 Body Mass Index (BMI) 40.0 and over, adult: Secondary | ICD-10-CM | POA: Insufficient documentation

## 2017-09-10 DIAGNOSIS — R7303 Prediabetes: Secondary | ICD-10-CM | POA: Insufficient documentation

## 2017-09-10 DIAGNOSIS — K219 Gastro-esophageal reflux disease without esophagitis: Secondary | ICD-10-CM | POA: Insufficient documentation

## 2017-09-10 DIAGNOSIS — K589 Irritable bowel syndrome without diarrhea: Secondary | ICD-10-CM | POA: Insufficient documentation

## 2017-09-11 ENCOUNTER — Encounter: Payer: 59 | Attending: Student | Admitting: Dietician

## 2017-09-11 ENCOUNTER — Encounter: Payer: Self-pay | Admitting: Dietician

## 2017-09-11 ENCOUNTER — Telehealth: Payer: Self-pay

## 2017-09-11 ENCOUNTER — Encounter: Payer: Self-pay | Admitting: Gastroenterology

## 2017-09-11 ENCOUNTER — Ambulatory Visit (INDEPENDENT_AMBULATORY_CARE_PROVIDER_SITE_OTHER): Payer: 59 | Admitting: Gastroenterology

## 2017-09-11 VITALS — BP 145/95 | HR 93 | Temp 98.3°F | Ht 62.0 in | Wt 325.6 lb

## 2017-09-11 VITALS — Ht 62.0 in | Wt 309.0 lb

## 2017-09-11 DIAGNOSIS — R197 Diarrhea, unspecified: Secondary | ICD-10-CM | POA: Diagnosis not present

## 2017-09-11 DIAGNOSIS — R7303 Prediabetes: Secondary | ICD-10-CM

## 2017-09-11 DIAGNOSIS — R1013 Epigastric pain: Secondary | ICD-10-CM

## 2017-09-11 DIAGNOSIS — G8929 Other chronic pain: Secondary | ICD-10-CM | POA: Diagnosis not present

## 2017-09-11 DIAGNOSIS — Z713 Dietary counseling and surveillance: Secondary | ICD-10-CM | POA: Diagnosis not present

## 2017-09-11 DIAGNOSIS — I10 Essential (primary) hypertension: Secondary | ICD-10-CM | POA: Insufficient documentation

## 2017-09-11 DIAGNOSIS — Z6841 Body Mass Index (BMI) 40.0 and over, adult: Secondary | ICD-10-CM | POA: Insufficient documentation

## 2017-09-11 DIAGNOSIS — E669 Obesity, unspecified: Secondary | ICD-10-CM | POA: Diagnosis not present

## 2017-09-11 NOTE — Progress Notes (Signed)
Medical Nutrition Therapy: Visit start time: 0915  end time: 0955  Assessment:  Diagnosis: obesity, pre-diabetes Medical history changes: no changes Psychosocial issues/ stress concerns: none  Current weight: 309lbs Height: 5'2" Medications, supplement changes: reconciled list in medical record  Progress and evaluation: Weight loss of 20lbs since 07/01/17. Patient reports getting full more quickly when eating; eating smaller portions. Less take-out food due to limited finances. Stopped drinking sodas. Starts new job on 09/17/17, 12p-9p.  Physical activity: none, back pain  Dietary Intake:  Usual eating pattern includes 1-2 meals and 0-2 snacks per day. Dining out frequency: 2-3 meals per week (snack portions).  Breakfast: often none, occasionally cooked meal -- eggs, [place] bacon Snack: honey bbq chips, vienna sausage  Bites  Lunch: chicken sandwich if hungry; depends on food available in the home Snack: none or same as am Supper: baked chicken legs, occasional fried chicken; spaghetti; hot dogs (3) with 2pcs bread; vegetables. [place] fries 3 times in past 2 weeks. Snack: none Beverages: water, occasional lemonade, no sodas  Nutrition Care Education: Topics covered: weight management Basic nutrition: appropriate nutrient balance, appropriate meal and snack schedule   Weight control: determining reasonable weight loss rate, reviewed role of exercise, eating at regular intervals; discussed developing plan for new work schedule to include food from home and eating often enough during the day. Diabetes: appropriate meal and snack schedule, appropriate carb intake and balance-- discussed healthy carb choices and appropriate portions. Hypertension: food sources of potassium, magnesium   Nutritional Diagnosis:  [place]-2.2 Altered nutrition-related laboratory As related to pre-diabetes.  As evidenced by lab report, history of hyperglycemia. [place]-3.3 Overweight/obesity As related to hypothyroidism,  inactivity, history of excess calories.  As evidenced by BMI 56, patient report.  Intervention: Commended patient for excellent progress made.    Updated goals with patient input     Education Materials given:  Marland Kitchen. Goals/ instructions   Learner/ who was taught:  . Patient  . Spouse/ partner  Level of understanding: Marland Kitchen. Verbalizes/ demonstrates competency   Demonstrated degree of understanding via:   Teach back Learning barriers: . None  Willingness to learn/ readiness for change: . Eager, change in progress  Monitoring and Evaluation:  Dietary intake, exercise, and body weight      follow up: 11/12/17

## 2017-09-11 NOTE — Telephone Encounter (Signed)
Pt has  Been schedule ruq u/s 09/13/17 Friday at 8:15 am.  Pt notified during office visit this am.  Instructed nothing to eat or drink 8 hours prior.  Thanks [employer]le

## 2017-09-11 NOTE — Progress Notes (Signed)
Wyline MoodKiran Leyland Kenna MD, MRCP(U.K) [address]  [address]  [place], [place] [address]  Main: [phone]  Fax: [phone]   Primary Care Physician: Carren Rangarter, [name], PA-C  Primary Gastroenterologist:  Dr. Wyline MoodKiran Raad Clayson   No chief complaint on file.   HPI: Katrina Meyers is a 26 y.o. female    Summary of history : She is here today to follow up to her last visit on 07/23/17 when she was initially seen for abdominal pain and cramping.  She was actually seen at the ER on 07/11/2017.  She had a hemoglobin of 11.2 MCV of 76.7 hemoglobin was 12.7 g 4 months prior she did have protein in the urine.  On 06/23/2017 a CT scan of the abdomen and pelvis was normal except showing that she had hepatomegaly at her initial visit she complained of abdominal pain going on since 2012 but more of an issue over the last few weeks.  The pain with right lower quadrant occurring every day lasting for 20-30 minutes each time worse when she turned her bowel body localized like a spasm movement and eating made it worse, pain occurred about 20 minutes after her meals and lasted for 45 minutes to up to an hour.  The pain could also occur in the upper abdomen as well.  Relieved by not eating and just waiting it out.  She has gained weight recently denies any NSAID use.  She has been on omeprazole previously but has not been taking it every day she does have a gallbladder intact.  At her last with a she also complained of increased frequency of bowel movements for the past 2 weeks anywhere ranging from 3-50 times a day I discomfort was relieved after bowel movement she did also have gas abdominal pain and distention.  At her last visit she was consuming a lot of artificial sweetener in her diet was not on metformin also prescribed on her MAR.   Interval history 10 /06/2017-  09/11/2017   Did not obtain the RUQ usg to r/o gall stones   Stool studies on 07/24/17 showed campylobacter in her stool .Z pack was prescribed and  seems to have resolved her symptoms.  Iron studies were normal C-reactive protein 13.9 . Celiac serology negative.   No issues presently . Having back pain and is going to be referred to a Chiropractic. Still has pain after eating .    Current Outpatient Medications  Medication Sig Dispense Refill  . Cholecalciferol (VITAMIN D3) 1000 units CAPS Take by mouth.    Marland Kitchen. albuterol (PROVENTIL HFA;VENTOLIN HFA) 108 (90 Base) MCG/ACT inhaler Inhale 2 puffs into the lungs every 6 (six) hours as needed for wheezing or shortness of breath. 1 Inhaler 0  . benzonatate (TESSALON PERLES) 100 MG capsule Take 1 capsule (100 mg total) by mouth every 6 (six) hours as needed for cough. 30 capsule 0  . dicyclomine (BENTYL) 20 MG tablet TK 1 T PO TID PRN FOR SPASMS  0  . gabapentin (NEURONTIN) 300 MG capsule Take by mouth.    . hydrochlorothiazide (HYDRODIURIL) 25 MG tablet Take by mouth.    Marland Kitchen. ibuprofen (ADVIL,MOTRIN) 600 MG tablet Take 1 tablet (600 mg total) by mouth every 8 (eight) hours as needed. 30 tablet 0  . levothyroxine (SYNTHROID, LEVOTHROID) 25 MCG tablet Take by mouth.    . metFORMIN (GLUCOPHAGE) 500 MG tablet Take 500 mg by mouth 2 (two) times daily with a meal.    . naproxen (NAPROSYN) 500 MG  tablet Take by mouth.    Marland Kitchen. omeprazole (PRILOSEC) 20 MG capsule Take 20 mg by mouth daily.    . ondansetron (ZOFRAN-ODT) 4 MG disintegrating tablet DIS ONE T PO Q 8 H PRN NV  0  . predniSONE (DELTASONE) 10 MG tablet Take 5 tablets (50 mg total) by mouth daily for 5 days. 25 tablet 0  . Spacer/Aero Chamber Mouthpiece MISC 1 Units by Does not apply route every 4 (four) hours as needed (wheezing). 1 each 0   No current facility-administered medications for this visit.     Allergies as of 09/11/2017 - Review Complete 09/09/2017  Allergen Reaction Noted  . Reglan [metoclopramide] Other (See Comments) 02/08/2014    ROS:  General: Negative for anorexia, weight loss, fever, chills, fatigue, weakness. ENT: Negative  for hoarseness, difficulty swallowing , nasal congestion. CV: Negative for chest pain, angina, palpitations, dyspnea on exertion, peripheral edema.  Respiratory: Negative for dyspnea at rest, dyspnea on exertion, cough, sputum, wheezing.  GI: See history of present illness. GU:  Negative for dysuria, hematuria, urinary incontinence, urinary frequency, nocturnal urination.  Endo: Negative for unusual weight change.    Physical Examination:   There were no vitals taken for this visit.  General: Well-nourished, well-developed in no acute distress.  Eyes: No icterus. Conjunctivae pink. Mouth: Oropharyngeal mucosa moist and pink , no lesions erythema or exudate. Lungs: Clear to auscultation bilaterally. Non-labored. Heart: Regular rate and rhythm, no murmurs rubs or gallops.  Abdomen: Bowel sounds are normal, nontender, nondistended, no hepatosplenomegaly or masses, no abdominal bruits or hernia , no rebound or guarding.   Extremities: No lower extremity edema. No clubbing or deformities. Neuro: Alert and oriented x 3.  Grossly intact. Skin: Warm and dry, no jaundice.   Psych: Alert and cooperative, normal mood and affect.   Imaging Studies: Dg Chest 2 View  Result Date: 09/09/2017 CLINICAL DATA:  Chest pain, shortness of Breath EXAM: CHEST  2 VIEW COMPARISON:  06/22/2017 FINDINGS: Peribronchial thickening. Heart and mediastinal contours are within normal limits. No focal opacities or effusions. No acute bony abnormality. IMPRESSION: Bronchitic changes. Electronically Signed   By: Charlett NoseKevin  Dover M.D.   On: 09/09/2017 12:06    Assessment and Plan:   Katrina Meyers is a 26 y.o. y/o female  Here for follow up for diarrhea secondary to campylobacter infection after a picnic. Responded well to Azithromycin. Has some residual post prandial pain . I will obtain a RUQ usg ti r/o gall stones .    Dr Wyline MoodKiran Alya Smaltz  MD,MRCP [facility](U.K) Follow up in 2 months

## 2017-09-11 NOTE — Patient Instructions (Signed)
   Great job making healthy changes!!  Allow adjustment time at new job. Use the schedule and make a plan to eat something every 4-6 hours, at least 3 times a day.   Continue to eat most meals at home, or supplement restaurant food with healthy sides like vegetables and/or fruit.  Gradually increase exercise as back pain gets relieved.

## 2017-09-13 ENCOUNTER — Ambulatory Visit
Admission: RE | Admit: 2017-09-13 | Discharge: 2017-09-13 | Disposition: A | Discharge status: Home / Self Care | Payer: 59 | Source: Ambulatory Visit | Attending: Gastroenterology | Admitting: Gastroenterology

## 2017-09-13 DIAGNOSIS — K760 Fatty (change of) liver, not elsewhere classified: Secondary | ICD-10-CM | POA: Diagnosis not present

## 2017-09-13 DIAGNOSIS — R1011 Right upper quadrant pain: Secondary | ICD-10-CM | POA: Diagnosis present

## 2017-09-13 DIAGNOSIS — R197 Diarrhea, unspecified: Secondary | ICD-10-CM | POA: Diagnosis not present

## 2017-09-17 ENCOUNTER — Telehealth: Payer: Self-pay

## 2017-09-17 NOTE — Telephone Encounter (Signed)
-----   Message from Wyline MoodKiran Anna, MD sent at 09/15/2017  5:30 PM EST ----- No gall stones- has fatty liver

## 2017-09-17 NOTE — Telephone Encounter (Signed)
Advised patient of results per Dr. Tobi BastosAnna.   - No gall stones- has fatty liver  Sent fatty liver information to MyChart.  Patient is aware.

## 2017-10-08 ENCOUNTER — Encounter: Payer: Self-pay | Admitting: Emergency Medicine

## 2017-10-08 ENCOUNTER — Emergency Department: Payer: 59

## 2017-10-08 ENCOUNTER — Other Ambulatory Visit: Payer: Self-pay

## 2017-10-08 DIAGNOSIS — E039 Hypothyroidism, unspecified: Secondary | ICD-10-CM | POA: Insufficient documentation

## 2017-10-08 DIAGNOSIS — E119 Type 2 diabetes mellitus without complications: Secondary | ICD-10-CM | POA: Insufficient documentation

## 2017-10-08 DIAGNOSIS — R079 Chest pain, unspecified: Secondary | ICD-10-CM | POA: Diagnosis present

## 2017-10-08 DIAGNOSIS — J42 Unspecified chronic bronchitis: Secondary | ICD-10-CM | POA: Insufficient documentation

## 2017-10-08 DIAGNOSIS — J209 Acute bronchitis, unspecified: Secondary | ICD-10-CM | POA: Diagnosis not present

## 2017-10-08 DIAGNOSIS — Z79899 Other long term (current) drug therapy: Secondary | ICD-10-CM | POA: Insufficient documentation

## 2017-10-08 DIAGNOSIS — I10 Essential (primary) hypertension: Secondary | ICD-10-CM | POA: Insufficient documentation

## 2017-10-08 LAB — CBC
HCT: 36 % (ref 35.0–47.0)
HEMOGLOBIN: 12.1 g/dL (ref 12.0–16.0)
MCH: 25.7 pg — ABNORMAL LOW (ref 26.0–34.0)
MCHC: 33.5 g/dL (ref 32.0–36.0)
MCV: 76.6 fL — ABNORMAL LOW (ref 80.0–100.0)
PLATELETS: 246 10*3/uL (ref 150–440)
RBC: 4.7 MIL/uL (ref 3.80–5.20)
RDW: 16.6 % — ABNORMAL HIGH (ref 11.5–14.5)
WBC: 6.7 10*3/uL (ref 3.6–11.0)

## 2017-10-08 NOTE — ED Triage Notes (Signed)
Patient ambulatory to triage with steady gait, without difficulty or distress noted; pt reports mid upper chest pain and back pain x 2weeks, palpitations and prod cough clear mucous; st hx of same with chronic bronchitis

## 2017-10-08 NOTE — ED Triage Notes (Signed)
Patient to ER for c/o chest and back pain. States she has h/o asthma and chronic bronchitis. States she has been using inhaler with no change or relief in pain. Patient speaking in complete sentences without difficulty.

## 2017-10-09 ENCOUNTER — Emergency Department
Admission: EM | Admit: 2017-10-09 | Discharge: 2017-10-09 | Disposition: A | Discharge status: Home / Self Care | Payer: 59 | Attending: Emergency Medicine | Admitting: Emergency Medicine

## 2017-10-09 ENCOUNTER — Encounter: Payer: Self-pay | Admitting: Emergency Medicine

## 2017-10-09 DIAGNOSIS — J209 Acute bronchitis, unspecified: Secondary | ICD-10-CM

## 2017-10-09 DIAGNOSIS — J42 Unspecified chronic bronchitis: Secondary | ICD-10-CM

## 2017-10-09 HISTORY — DX: Morbid (severe) obesity due to excess calories: E66.01

## 2017-10-09 HISTORY — DX: Unspecified chronic bronchitis: J42

## 2017-10-09 LAB — BASIC METABOLIC PANEL
ANION GAP: 8 (ref 5–15)
BUN: 8 mg/dL (ref 6–20)
CALCIUM: 8.4 mg/dL — AB (ref 8.9–10.3)
CHLORIDE: 104 mmol/L (ref 101–111)
CO2: 23 mmol/L (ref 22–32)
CREATININE: 0.88 mg/dL (ref 0.44–1.00)
GFR calc non Af Amer: >60 mL/min (ref >60)
Glucose, Bld: 111 mg/dL — ABNORMAL HIGH (ref 65–99)
Potassium: 3.4 mmol/L — ABNORMAL LOW (ref 3.5–5.1)
SODIUM: 135 mmol/L (ref 135–145)

## 2017-10-09 LAB — TROPONIN I

## 2017-10-09 MED ORDER — BENZONATATE 100 MG PO CAPS: 100.0000 mg | ORAL_CAPSULE | Freq: Three times a day (TID) | ORAL | 0 refills | Status: DC | PRN

## 2017-10-09 MED ORDER — PREDNISONE 20 MG PO TABS
60.0000 mg | ORAL_TABLET | ORAL | Status: AC
  Administered 2017-10-09: 60 mg via ORAL
  Filled 2017-10-09: qty 3

## 2017-10-09 MED ORDER — IPRATROPIUM-ALBUTEROL 0.5-2.5 (3) MG/3ML IN SOLN
6.0000 mL | Freq: Once | RESPIRATORY_TRACT | Status: AC
Start: 2017-10-09 — End: 2017-10-09
  Administered 2017-10-09: 6 mL via RESPIRATORY_TRACT
  Filled 2017-10-09: qty 6

## 2017-10-09 MED ORDER — PREDNISONE 10 MG PO TABS: ORAL_TABLET | ORAL | 0 refills | Status: DC

## 2017-10-09 MED ORDER — ALBUTEROL SULFATE HFA 108 (90 BASE) MCG/ACT IN AERS: INHALATION_SPRAY | RESPIRATORY_TRACT | 1 refills | Status: AC

## 2017-10-09 NOTE — ED Provider Notes (Signed)
[facility] Emergency Department Provider Note  ____________________________________________   First MD Initiated Contact with Patient 10/09/17 0240     (approximate)  I have reviewed the triage vital signs and the nursing notes.   HISTORY  Chief Complaint Chest Pain    HPI Katrina Meyers is a 26 y.o. female with medical history as listed below which includes morbid obesity and chronic bronchitis and ongoing tobacco use.  She presents for persistent severe cough and associated chest wall tenderness that radiates through to her back.  She was seen in this emergency department about a month ago for the same symptoms.  A burst course of prednisone seemed to help a little bit but the symptoms did not improve significantly and her cough has not gone away.  She has tried to quit smoking and has managed to cut back but still smokes at least some cigarettes regularly.  She knows that her obesity is a contributory factor.  She has no history of DVT or pulmonary embolism and has had no leg pain or swelling recently.  Her symptoms are not significantly changed in the last month but she is frustrated at the persistence.  These symptoms are worse with exertion, slightly better with rest.  She denies fever/chills, nausea, vomiting, and abdominal pain.  Past Medical History:  Diagnosis Date  . Borderline diabetic   . Chronic bronchitis (HCC)   . Hypertension   . IBS (irritable bowel syndrome)   . Morbid obesity (HCC)   . Reflux   . Thyroid disease     Patient Active Problem List   Diagnosis Date Noted  . Class 3 severe obesity due to excess calories with serious comorbidity and body mass index (BMI) of 50.0 to 59.9 in adult (HCC) 09/10/2017  . Gastroesophageal reflux disease without esophagitis 09/10/2017  . Irritable bowel syndrome 09/10/2017  . Prediabetes 09/10/2017  . HTN (hypertension) 05/28/2017  . Abnormal Pap smear of cervix 10/15/2016  . Hypothyroidism,  adult 06/28/2016  . Extremity pain 03/26/2014    Past Surgical History:  Procedure Laterality Date  . UPPER ENDOSCOPY W/ SCLEROTHERAPY      Prior to Admission medications   Medication Sig Start Date End Date Taking? Authorizing Provider  albuterol (PROVENTIL HFA;VENTOLIN HFA) 108 (90 Base) MCG/ACT inhaler Inhale 2-4 puffs by mouth every 4 hours as needed for wheezing, cough, and/or shortness of breath 10/09/17   Loleta RoseForbach, Jakylan Ron, MD  benzonatate (TESSALON PERLES) 100 MG capsule Take 1 capsule (100 mg total) by mouth 3 (three) times daily as needed for cough. 10/09/17   Loleta RoseForbach, Faye Sanfilippo, MD  dicyclomine (BENTYL) 20 MG tablet TK 1 T PO TID PRN FOR SPASMS 07/11/17   [provider]  hydrochlorothiazide (HYDRODIURIL) 25 MG tablet Take by mouth. 02/12/17   [provider]  ibuprofen (ADVIL,MOTRIN) 600 MG tablet Take 1 tablet (600 mg total) by mouth every 8 (eight) hours as needed. 09/09/17   Merrily Brittleifenbark, Neil, MD  levothyroxine (SYNTHROID, LEVOTHROID) 25 MCG tablet Take by mouth. 02/12/17   [provider]  metFORMIN (GLUCOPHAGE) 500 MG tablet Take 500 mg by mouth 2 (two) times daily with a meal.    [provider]  naproxen (NAPROSYN) 500 MG tablet Take by mouth.    [provider]  nicotine (NICODERM CQ - DOSED IN MG/24 HOURS) 14 mg/24hr patch Place onto the skin. 10/24/17 11/07/17  [provider]  omeprazole (PRILOSEC) 20 MG capsule Take 20 mg by mouth daily.    [provider]  ondansetron (ZOFRAN-ODT) 4 MG disintegrating tablet DIS ONE T PO Q 8 H PRN NV 07/11/17   [provider]  predniSONE (DELTASONE) 10 MG tablet Take 6 tabs (60 mg) PO x 3 days, then take 4 tabs (40 mg) PO x 3 days, then take 2 tabs (20 mg) PO x 3 days, then take 1 tab (10 mg) PO x 3 days, then take 1/2 tab (5 mg) PO x 4 days. 10/09/17   Loleta RoseForbach, Abbagayle Zaragoza, MD  Spacer/Aero Chamber Mouthpiece MISC 1 Units by Does not apply route every 4 (four) hours as needed (wheezing).  09/09/17   Merrily Brittleifenbark, Neil, MD    Allergies Reglan [metoclopramide]  History reviewed. No pertinent family history.  Social History Social History   Tobacco Use  . Smoking status: Current Every Day Smoker    Packs/day: 0.25    Types: Cigarettes  . Smokeless tobacco: Never Used  . Tobacco comment: has called 1-800-quit now and is decreasing smoking  Substance Use Topics  . Alcohol use: No  . Drug use: No    Review of Systems Constitutional: No fever/chills Cardiovascular: Chest wall tenderness associated with cough Respiratory: SOB, persistent cough Gastrointestinal: No abdominal pain.  No nausea, no vomiting.  No diarrhea.  No constipation. Musculoskeletal: Back pain associated with cough Integumentary: Negative for rash. Neurological: Negative for headaches, focal weakness or numbness.   ____________________________________________   PHYSICAL EXAM:  VITAL SIGNS: ED Triage Vitals  Enc Vitals Group     BP 10/08/17 2304 (!) 178/100     Pulse Rate 10/09/17 0203 95     Resp 10/08/17 2304 (!) 22     Temp 10/08/17 2304 98.5 F (36.9 C)     Temp Source 10/08/17 2304 Oral     SpO2 10/08/17 2304 100 %     Weight 10/08/17 2302 (!) 147.4 kg (325 lb)     Height 10/08/17 2302 1.575 m (5\' 2" )     Head Circumference --      Peak Flow --      Pain Score 10/08/17 2302 10     Pain Loc --      Pain Edu? --      Excl. in GC? --     Constitutional: Alert and oriented. Well appearing and in no acute distress. Eyes: Conjunctivae are normal.  Head: Atraumatic. Nose: No congestion/rhinnorhea. Mouth/Throat: Mucous membranes are moist. Neck: No stridor.  No meningeal signs.   Cardiovascular: Normal rate, regular rhythm. Good peripheral circulation. Grossly normal heart sounds. Respiratory: Normal respiratory effort.  No retractions. Mild expiratory wheezing Gastrointestinal: Obese. Soft and nontender. No distention.  Musculoskeletal: No lower extremity tenderness nor edema. No  gross deformities of extremities. Neurologic:  Normal speech and language. No gross focal neurologic deficits are appreciated.  Skin:  Skin is warm, dry and intact. No rash noted. Psychiatric: Mood and affect are normal. Speech and behavior are normal.  ____________________________________________   LABS (all labs ordered are listed, but only abnormal results are displayed)  Labs Reviewed  BASIC METABOLIC PANEL - Abnormal; Notable for the following components:      Result Value   Potassium 3.4 (*)    Glucose, Bld 111 (*)    Calcium 8.4 (*)    All other components within normal limits  CBC - Abnormal; Notable for the following components:   MCV 76.6 (*)    MCH 25.7 (*)    RDW 16.6 (*)    All other components within normal limits  TROPONIN I  ____________________________________________  EKG  ED ECG REPORT I, Loleta Rose, the attending physician, personally viewed and interpreted this ECG.  Date: 10/08/2017 EKG Time: 23: 09 Rate: 102 Rhythm: borderline sinus tachycardia QRS Axis: normal Intervals: normal ST/T Wave abnormalities: normal Narrative Interpretation: no evidence of acute ischemia  ____________________________________________  RADIOLOGY   Dg Chest 2 View  Result Date: 10/08/2017 CLINICAL DATA:  26 y/o  F; chest and back pain. EXAM: CHEST  2 VIEW COMPARISON:  09/09/2017 chest radiograph FINDINGS: Stable heart size and mediastinal contours are within normal limits. Both lungs are clear. The visualized skeletal structures are unremarkable. IMPRESSION: No acute pulmonary process identified. Electronically Signed   By: Mitzi Hansen M.D.   On: 10/08/2017 23:26    ____________________________________________   PROCEDURES  Critical Care performed: No   Procedure(s) performed:   Procedures   ____________________________________________   INITIAL IMPRESSION / ASSESSMENT AND PLAN / ED COURSE  As part of my medical decision making, I  reviewed the following data within the electronic MEDICAL RECORD NUMBER Nursing notes reviewed and incorporated, Labs reviewed , EKG interpreted , Old chart reviewed, Radiograph reviewed  and Notes from prior ED visits    Differential includes, but is not limited to, viral syndrome, bronchitis including COPD exacerbation, pneumonia, reactive airway disease including asthma, CHF including exacerbation with or without pulmonary/interstitial edema, pneumothorax, ACS, thoracic trauma, and pulmonary embolism.  This patient unfortunately seems to be suffering from persistent reactive airway disease, either undiagnosed asthma chronic bronchitis/COPD although she is quite young in spite of her tobacco history.  She initially had some very mild tachycardia after she walked into the emergency department but that resolved and she is PERC negative.  She has coughed multiple times during my evaluation and she has a characteristic obstructive sounding cough.  I think she would benefit from following up with pulmonology and I will provide her with some follow-up information.  I will give her prednisone taper this time instead of a burst course and provide another prescription for albuterol just to make sure she has treatment, but I think that working with a pulmonologist or primary care provider that can help her with maintenance medications will help a lot.  I gave my usual customary return precautions and she understands and agrees with the plan.     ____________________________________________  FINAL CLINICAL IMPRESSION(S) / ED DIAGNOSES  Final diagnoses:  Chronic bronchitis with acute exacerbation (HCC)     MEDICATIONS GIVEN DURING THIS VISIT:  Medications  ipratropium-albuterol (DUONEB) 0.5-2.5 (3) MG/3ML nebulizer solution 6 mL (6 mLs Nebulization Given 10/09/17 0329)  predniSONE (DELTASONE) tablet 60 mg (60 mg Oral Given 10/09/17 0328)     ED Discharge Orders        Ordered    predniSONE (DELTASONE)  10 MG tablet     10/09/17 0349    albuterol (PROVENTIL HFA;VENTOLIN HFA) 108 (90 Base) MCG/ACT inhaler     10/09/17 0349    benzonatate (TESSALON PERLES) 100 MG capsule  3 times daily PRN     10/09/17 0349       Note:  This document was prepared using Dragon voice recognition software and may include unintentional dictation errors.    Loleta Rose, MD 10/09/17 [phone]

## 2017-10-09 NOTE — Discharge Instructions (Signed)
As we discussed, although we found no evidence of an acute medical condition at this time, we feel he might benefit from seeing a lung specialist with whom he can follow-up to discuss long-term treatment of your ongoing cough and other lung issues.  We also recommend he discuss this with your primary care provider.  We provided a medicine that may help with cough (Tessalon Perles) and recommend you also take the prednisone taper as written.  I provided another prescription for you for the albuterol inhaler so that you do not run out.  Return to the emergency department if you develop new or worsening symptoms that concern you.

## 2017-10-15 DIAGNOSIS — F32A Depression, unspecified: Secondary | ICD-10-CM

## 2017-10-15 DIAGNOSIS — F419 Anxiety disorder, unspecified: Secondary | ICD-10-CM

## 2017-10-15 HISTORY — DX: Depression, unspecified: F32.A

## 2017-10-15 HISTORY — DX: Anxiety disorder, unspecified: F41.9

## 2017-11-12 ENCOUNTER — Ambulatory Visit: Payer: 59 | Admitting: Gastroenterology

## 2017-11-12 ENCOUNTER — Ambulatory Visit: Payer: 59 | Admitting: Dietician

## 2017-12-12 ENCOUNTER — Encounter: Payer: Self-pay | Admitting: Dietician

## 2017-12-12 NOTE — Progress Notes (Signed)
Have not heard from patient to reschedule her missed appointment from 11/12/17. Attempted to call her but phone number is not working at this time. Sent discharge letter to referring provider.

## 2017-12-16 ENCOUNTER — Ambulatory Visit: Payer: 59 | Admitting: Gastroenterology

## 2017-12-21 ENCOUNTER — Emergency Department
Admission: EM | Admit: 2017-12-21 | Discharge: 2017-12-21 | Disposition: A | Discharge status: Home / Self Care | Payer: 59 | Attending: Emergency Medicine | Admitting: Emergency Medicine

## 2017-12-21 ENCOUNTER — Other Ambulatory Visit: Payer: Self-pay

## 2017-12-21 ENCOUNTER — Encounter: Payer: Self-pay | Admitting: Emergency Medicine

## 2017-12-21 DIAGNOSIS — F17210 Nicotine dependence, cigarettes, uncomplicated: Secondary | ICD-10-CM | POA: Diagnosis not present

## 2017-12-21 DIAGNOSIS — Z7984 Long term (current) use of oral hypoglycemic drugs: Secondary | ICD-10-CM | POA: Diagnosis not present

## 2017-12-21 DIAGNOSIS — E039 Hypothyroidism, unspecified: Secondary | ICD-10-CM | POA: Diagnosis not present

## 2017-12-21 DIAGNOSIS — I10 Essential (primary) hypertension: Secondary | ICD-10-CM | POA: Diagnosis not present

## 2017-12-21 DIAGNOSIS — R51 Headache: Secondary | ICD-10-CM | POA: Insufficient documentation

## 2017-12-21 DIAGNOSIS — R064 Hyperventilation: Secondary | ICD-10-CM | POA: Diagnosis present

## 2017-12-21 DIAGNOSIS — F419 Anxiety disorder, unspecified: Secondary | ICD-10-CM | POA: Diagnosis not present

## 2017-12-21 DIAGNOSIS — Z79899 Other long term (current) drug therapy: Secondary | ICD-10-CM | POA: Insufficient documentation

## 2017-12-21 MED ORDER — TRAMADOL HCL 50 MG PO TABS
50.0000 mg | ORAL_TABLET | Freq: Once | ORAL | Status: AC
  Administered 2017-12-21: 50 mg via ORAL
  Filled 2017-12-21: qty 1

## 2017-12-21 MED ORDER — HYDROXYZINE HCL 10 MG PO TABS: 10.0000 mg | ORAL_TABLET | Freq: Three times a day (TID) | ORAL | 0 refills | Status: DC | PRN

## 2017-12-21 NOTE — ED Triage Notes (Signed)
States got in argument with fiance. States got stressed. States face was numb on scene. States felt like blood pressure was up. Now sitting in chair trying to calm self. Face no longer numb.
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
## 2017-12-27 ENCOUNTER — Emergency Department
Admission: EM | Admit: 2017-12-27 | Discharge: 2017-12-27 | Disposition: A | Discharge status: Home / Self Care | Payer: 59 | Attending: Emergency Medicine | Admitting: Emergency Medicine

## 2017-12-27 ENCOUNTER — Encounter: Payer: Self-pay | Admitting: Emergency Medicine

## 2017-12-27 ENCOUNTER — Other Ambulatory Visit: Payer: Self-pay

## 2017-12-27 DIAGNOSIS — J111 Influenza due to unidentified influenza virus with other respiratory manifestations: Secondary | ICD-10-CM | POA: Insufficient documentation

## 2017-12-27 DIAGNOSIS — Z7984 Long term (current) use of oral hypoglycemic drugs: Secondary | ICD-10-CM | POA: Insufficient documentation

## 2017-12-27 DIAGNOSIS — I10 Essential (primary) hypertension: Secondary | ICD-10-CM | POA: Insufficient documentation

## 2017-12-27 DIAGNOSIS — R69 Illness, unspecified: Secondary | ICD-10-CM

## 2017-12-27 DIAGNOSIS — M791 Myalgia, unspecified site: Secondary | ICD-10-CM | POA: Diagnosis present

## 2017-12-27 DIAGNOSIS — E039 Hypothyroidism, unspecified: Secondary | ICD-10-CM | POA: Diagnosis not present

## 2017-12-27 DIAGNOSIS — Z79899 Other long term (current) drug therapy: Secondary | ICD-10-CM | POA: Diagnosis not present

## 2017-12-27 DIAGNOSIS — F17210 Nicotine dependence, cigarettes, uncomplicated: Secondary | ICD-10-CM | POA: Diagnosis not present

## 2017-12-27 MED ORDER — PSEUDOEPH-BROMPHEN-DM 30-2-10 MG/5ML PO SYRP: 5.0000 mL | ORAL_SOLUTION | Freq: Four times a day (QID) | ORAL | 0 refills | Status: DC | PRN

## 2017-12-27 MED ORDER — IBUPROFEN 800 MG PO TABS: 800.0000 mg | ORAL_TABLET | Freq: Three times a day (TID) | ORAL | 0 refills | Status: DC | PRN

## 2017-12-27 NOTE — ED Provider Notes (Signed)
[facility] Emergency Department Provider Note   ____________________________________________   First MD Initiated Contact with Patient 12/27/17 0848     (approximate)  I have reviewed the triage vital signs and the nursing notes.   HISTORY  Chief Complaint Generalized Body Aches; Cough; and Headache    HPI Katrina Meyers is a 27 y.o. female patient complain of body aches, cough, headache.  Symptoms began 5 days ago.  Patient states she is has been exposed to the flu by family members.  Patient is not taking flu shot for this season.  Patient denies nausea, vomiting, diarrhea.  Past Medical History:  Diagnosis Date  . Borderline diabetic   . Chronic bronchitis (HCC)   . Hypertension   . IBS (irritable bowel syndrome)   . Morbid obesity (HCC)   . Reflux   . Thyroid disease     Patient Active Problem List   Diagnosis Date Noted  . Class 3 severe obesity due to excess calories with serious comorbidity and body mass index (BMI) of 50.0 to 59.9 in adult (HCC) 09/10/2017  . Gastroesophageal reflux disease without esophagitis 09/10/2017  . Irritable bowel syndrome 09/10/2017  . Prediabetes 09/10/2017  . HTN (hypertension) 05/28/2017  . Abnormal Pap smear of cervix 10/15/2016  . Hypothyroidism, adult 06/28/2016  . Extremity pain 03/26/2014    Past Surgical History:  Procedure Laterality Date  . UPPER ENDOSCOPY W/ SCLEROTHERAPY      Prior to Admission medications   Medication Sig Start Date End Date Taking? Authorizing Provider  albuterol (PROVENTIL HFA;VENTOLIN HFA) 108 (90 Base) MCG/ACT inhaler Inhale 2-4 puffs by mouth every 4 hours as needed for wheezing, cough, and/or shortness of breath 10/09/17   Loleta Rose, MD  benzonatate (TESSALON PERLES) 100 MG capsule Take 1 capsule (100 mg total) by mouth 3 (three) times daily as needed for cough. 10/09/17   Loleta Rose, MD  brompheniramine-pseudoephedrine-DM 30-2-10 MG/5ML syrup Take 5 mLs by  mouth 4 (four) times daily as needed. 12/27/17   Joni Reining, PA-C  dicyclomine (BENTYL) 20 MG tablet TK 1 T PO TID PRN FOR SPASMS 07/11/17   [provider]  hydrochlorothiazide (HYDRODIURIL) 25 MG tablet Take by mouth. 02/12/17   [provider]  hydrOXYzine (ATARAX/VISTARIL) 10 MG tablet Take 1 tablet (10 mg total) by mouth 3 (three) times daily as needed. 12/21/17   Enid Derry, PA-C  ibuprofen (ADVIL,MOTRIN) 600 MG tablet Take 1 tablet (600 mg total) by mouth every 8 (eight) hours as needed. 09/09/17   Merrily Brittle, MD  ibuprofen (ADVIL,MOTRIN) 800 MG tablet Take 1 tablet (800 mg total) by mouth every 8 (eight) hours as needed for moderate pain. 12/27/17   Joni Reining, PA-C  levothyroxine (SYNTHROID, LEVOTHROID) 25 MCG tablet Take by mouth. 02/12/17   [provider]  metFORMIN (GLUCOPHAGE) 500 MG tablet Take 500 mg by mouth 2 (two) times daily with a meal.    [provider]  naproxen (NAPROSYN) 500 MG tablet Take by mouth.    [provider]  omeprazole (PRILOSEC) 20 MG capsule Take 20 mg by mouth daily.    [provider]  ondansetron (ZOFRAN-ODT) 4 MG disintegrating tablet DIS ONE T PO Q 8 H PRN NV 07/11/17   [provider]  predniSONE (DELTASONE) 10 MG tablet Take 6 tabs (60 mg) PO x 3 days, then take 4 tabs (40 mg) PO x 3 days, then take 2 tabs (20 mg) PO x 3 days, then take  1 tab (10 mg) PO x 3 days, then take 1/2 tab (5 mg) PO x 4 days. 10/09/17   Loleta RoseForbach, Cory, MD  Spacer/Aero Chamber Mouthpiece MISC 1 Units by Does not apply route every 4 (four) hours as needed (wheezing). 09/09/17   Merrily Brittleifenbark, Neil, MD    Allergies Reglan [metoclopramide]  No family history on file.  Social History Social History   Tobacco Use  . Smoking status: Current Every Day Smoker    Packs/day: 0.30    Types: Cigarettes  . Smokeless tobacco: Never Used  . Tobacco comment: has called 1-800-quit now and is decreasing smoking    Substance Use Topics  . Alcohol use: No  . Drug use: No    Review of Systems Constitutional: No fever/chills.  Body aches. Eyes: No visual changes. ENT: Nasal congestion runny nose.  Cardiovascular: Denies chest pain. Respiratory: Denies shortness of breath.  Nonproductive cough. Gastrointestinal: No abdominal pain.  No nausea, no vomiting.  No diarrhea.  No constipation. Genitourinary: Negative for dysuria. Musculoskeletal: Negative for back pain. Skin: Negative for rash. Neurological: Negative for headaches, focal weakness or numbness. Endocrine:Hypothyroidism and hypertension.  Prediabetic. Allergic/Immunilogical: Reglan  ____________________________________________   PHYSICAL EXAM:  VITAL SIGNS: ED Triage Vitals  Enc Vitals Group     BP 12/27/17 0823 (!) 159/110     Pulse Rate 12/27/17 0823 86     Resp 12/27/17 0823 20     Temp 12/27/17 0823 98.2 F (36.8 C)     Temp Source 12/27/17 0823 Oral     SpO2 12/27/17 0823 98 %     Weight 12/27/17 0824 (!) 322 lb (146.1 kg)     Height 12/27/17 0824 5\' 2"  (1.575 m)     Head Circumference --      Peak Flow --      Pain Score 12/27/17 0823 7     Pain Loc --      Pain Edu? --      Excl. in GC? --    Constitutional: Alert and oriented. Well appearing and in no acute distress. Nose: Edematous nasal turbinates clear rhinorrhea.   Mouth/Throat: Mucous membranes are moist.  Oropharynx non-erythematous.  Postnasal drainage. Neck: No stridor.   Hematological/Lymphatic/Immunilogical: No cervical lymphadenopathy. Cardiovascular: Normal rate, regular rhythm. Grossly normal heart sounds.  Good peripheral circulation.  Elevated blood pressure Respiratory: Normal respiratory effort.  No retractions. Lungs CTAB. Neurologic:  Normal speech and language. No gross focal neurologic deficits are appreciated. No gait instability. Skin:  Skin is warm, dry and intact. No rash noted. Psychiatric: Mood and affect are normal. Speech and behavior  are normal.  ____________________________________________   LABS (all labs ordered are listed, but only abnormal results are displayed)  Labs Reviewed - No data to display ____________________________________________  EKG   ____________________________________________  RADIOLOGY  ED MD interpretation:    Official radiology report(s): No results found.  ____________________________________________   PROCEDURES  Procedure(s) performed: None  Procedures  Critical Care performed: No  ____________________________________________   INITIAL IMPRESSION / ASSESSMENT AND PLAN / ED COURSE  As part of my medical decision making, I reviewed the following data within the electronic MEDICAL RECORD NUMBER    Patient presents with body aches, cough, headache.  Patient has recent exposure to flu.  Discussed with patient rationale for not testing and treating with antiviral medication at this time.  Patient given discharge care instruction advised take medication as directed.  Patient advised follow-up with PCP if no improvement in 3-5 days.  ____________________________________________   FINAL CLINICAL IMPRESSION(S) / ED DIAGNOSES  Final diagnoses:  Influenza-like illness     ED Discharge Orders        Ordered    brompheniramine-pseudoephedrine-DM 30-2-10 MG/5ML syrup  4 times daily PRN     12/27/17 0842    ibuprofen (ADVIL,MOTRIN) 800 MG tablet  Every 8 hours PRN     12/27/17 0842       Note:  This document was prepared using Dragon voice recognition software and may include unintentional dictation errors.    Joni Reining, PA-C 12/27/17 9604    Jene Every, MD 12/27/17 [phone]

## 2017-12-27 NOTE — ED Triage Notes (Signed)
Patient here complaining of body aches, cough, and headache.  States sx began on Sat.  Wearing face mask.  States she has been exposed to similar symptoms recently, family member had the flu.

## 2017-12-27 NOTE — ED Notes (Signed)
See triage note  States she developed cough,body aches and headache  Denies nay fever but has had"hot" spells  Afebrile on arrival

## 2018-02-01 IMAGING — CR DG CHEST 2V
2 series · 2 of 2 positions shown · non-contrast
Comparison: None.

CLINICAL DATA: Left-sided chest pain with short of breath

EXAM:
CHEST  2 VIEW

[chest pa]
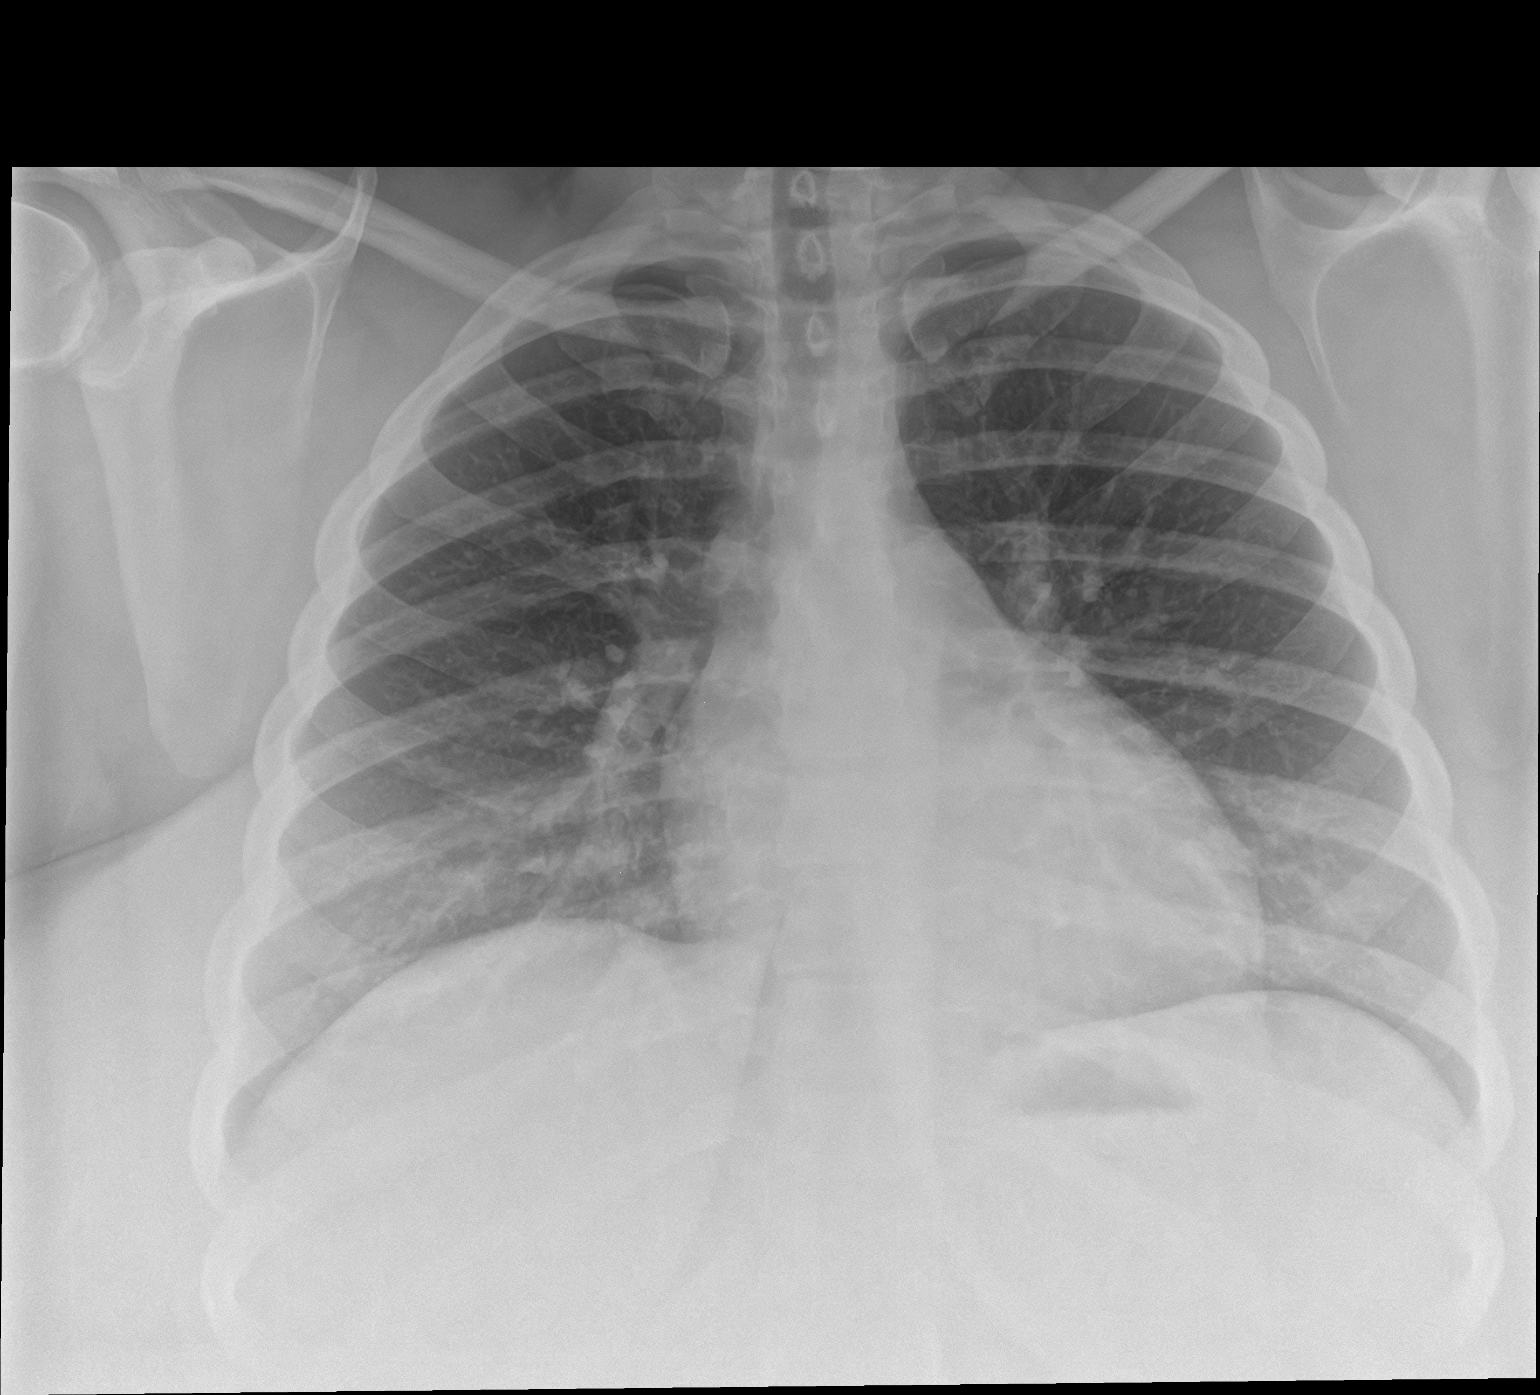

[chest lat]
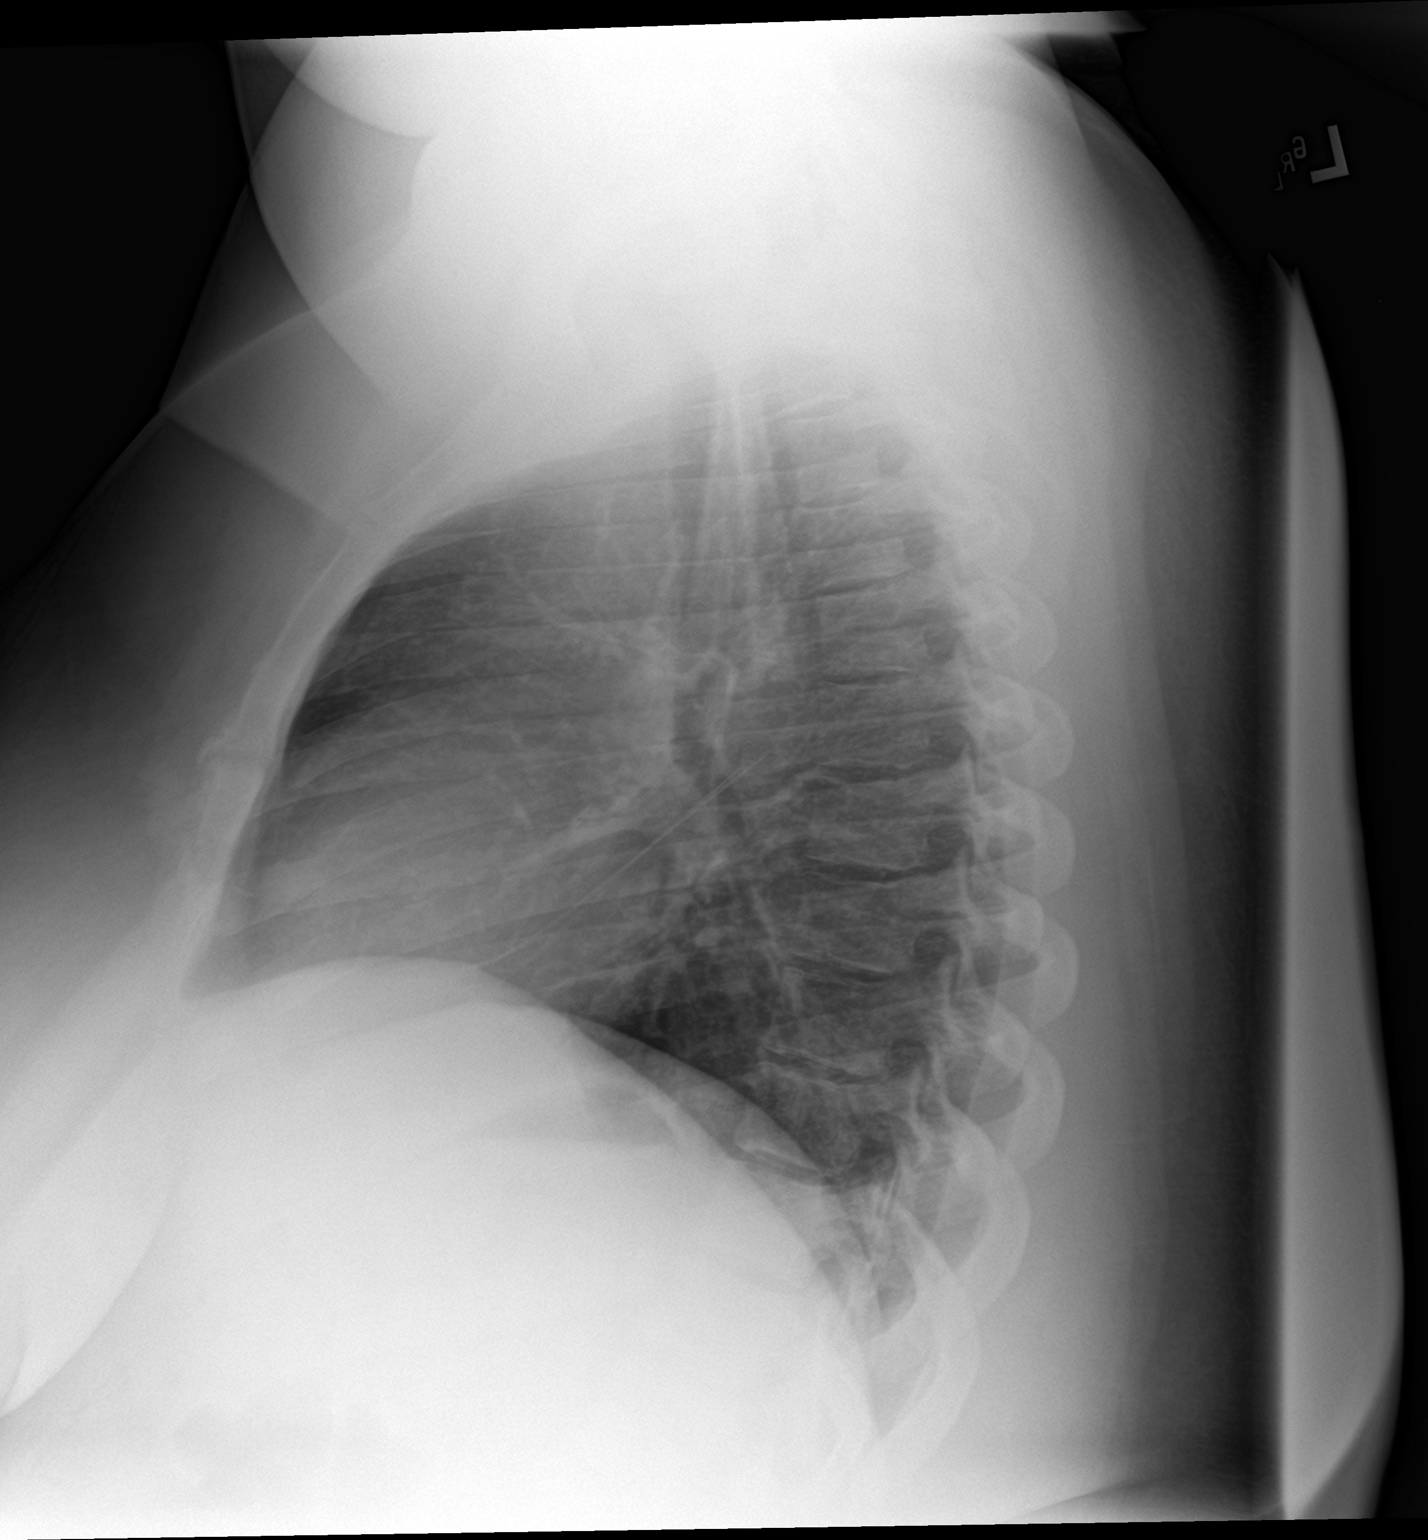

[2 of 2 positions shown; findings below may reference images not displayed]

FINDINGS: The heart size and mediastinal contours are within normal limits.
Both lungs are clear. The visualized skeletal structures are
unremarkable.
IMPRESSION: No active cardiopulmonary disease.

## 2018-05-20 IMAGING — CR DG CHEST 2V
1 series · 2 of 2 positions shown · non-contrast
Comparison: 09/09/2017 chest radiograph

CLINICAL DATA: 26 y/o  F; chest and back pain.

EXAM:
CHEST  2 VIEW

[Series 1: dg chest 2 view · 0.14mm/px · 2 of 2 slices shown]
[im 1/2]
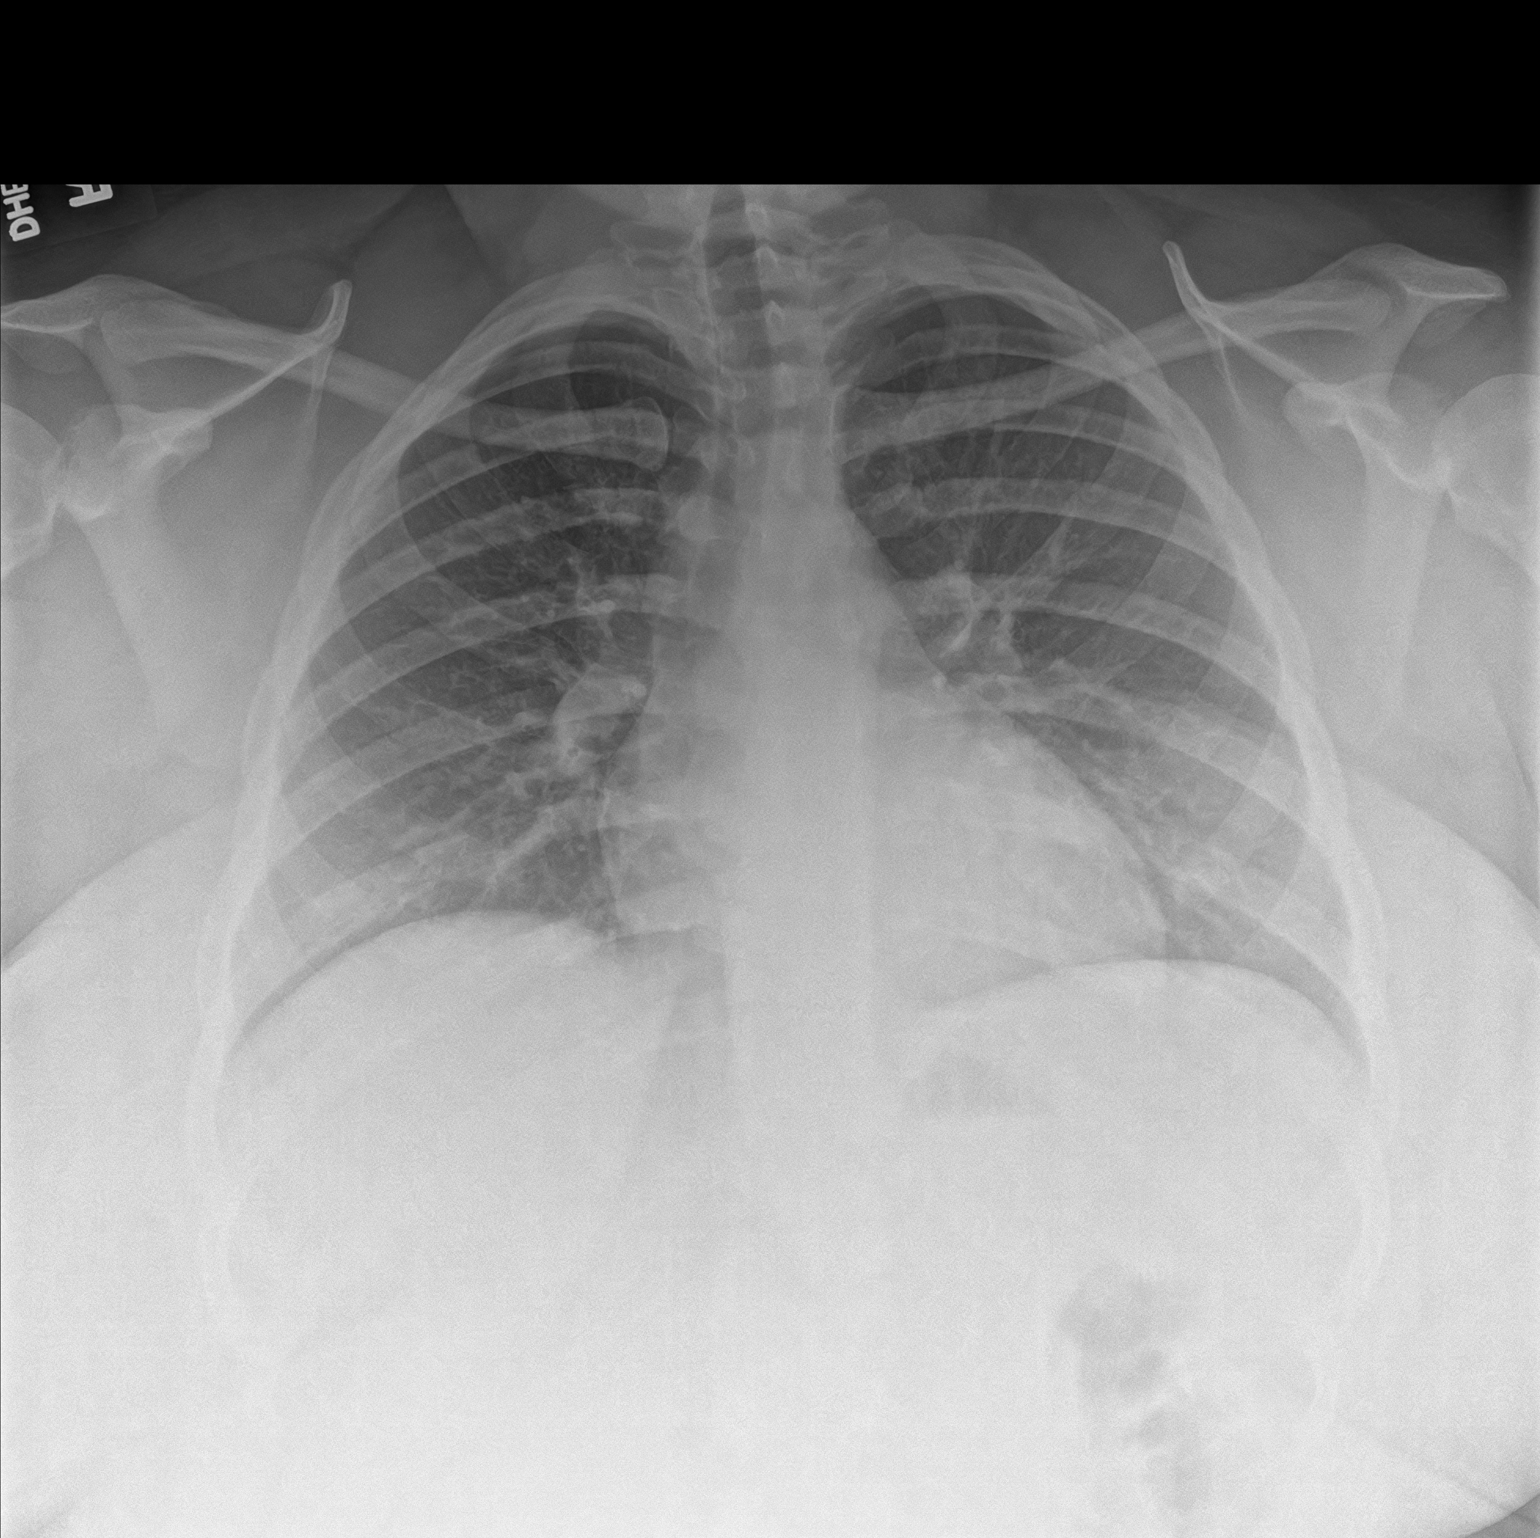
[im 2/2]
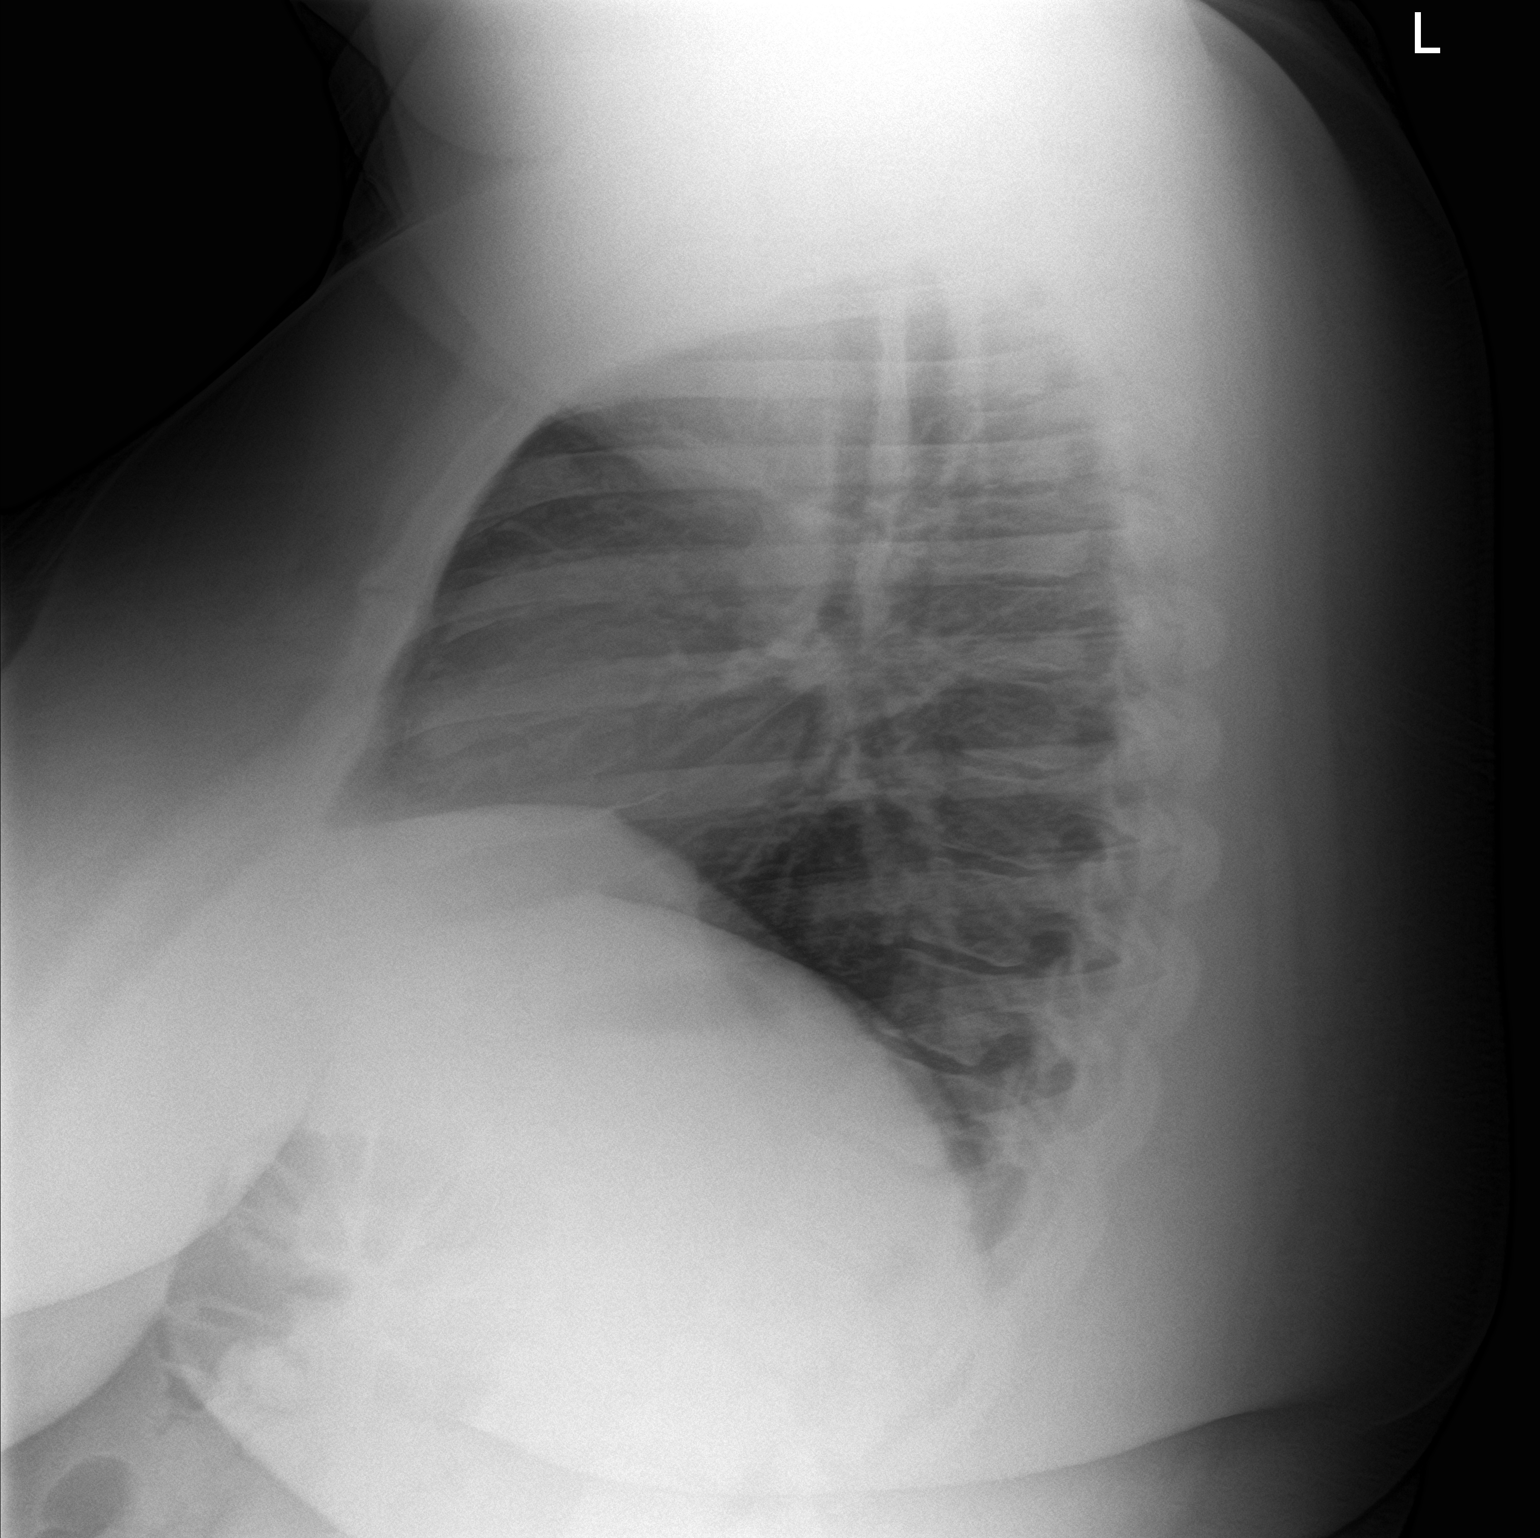

[2 of 2 positions shown; findings below may reference images not displayed]

FINDINGS: Stable heart size and mediastinal contours are within normal limits.
Both lungs are clear. The visualized skeletal structures are
unremarkable.
IMPRESSION: No acute pulmonary process identified.

By: Souleymane Chava M.D.

## 2018-06-04 ENCOUNTER — Ambulatory Visit: Payer: Self-pay | Admitting: Obstetrics and Gynecology

## 2018-06-04 ENCOUNTER — Ambulatory Visit (INDEPENDENT_AMBULATORY_CARE_PROVIDER_SITE_OTHER): Payer: 59 | Admitting: Obstetrics and Gynecology

## 2018-06-04 ENCOUNTER — Encounter: Payer: Self-pay | Admitting: Obstetrics and Gynecology

## 2018-06-04 VITALS — BP 160/100 | HR 98 | Ht 62.0 in | Wt 340.0 lb

## 2018-06-04 DIAGNOSIS — F419 Anxiety disorder, unspecified: Secondary | ICD-10-CM | POA: Insufficient documentation

## 2018-06-04 DIAGNOSIS — I10 Essential (primary) hypertension: Secondary | ICD-10-CM

## 2018-06-04 DIAGNOSIS — R87612 Low grade squamous intraepithelial lesion on cytologic smear of cervix (LGSIL): Secondary | ICD-10-CM

## 2018-06-04 DIAGNOSIS — F418 Other specified anxiety disorders: Secondary | ICD-10-CM | POA: Diagnosis not present

## 2018-06-04 DIAGNOSIS — L732 Hidradenitis suppurativa: Secondary | ICD-10-CM

## 2018-06-04 DIAGNOSIS — N911 Secondary amenorrhea: Secondary | ICD-10-CM | POA: Insufficient documentation

## 2018-06-04 DIAGNOSIS — N912 Amenorrhea, unspecified: Secondary | ICD-10-CM | POA: Diagnosis not present

## 2018-06-04 DIAGNOSIS — F32A Depression, unspecified: Secondary | ICD-10-CM | POA: Insufficient documentation

## 2018-06-04 DIAGNOSIS — F329 Major depressive disorder, single episode, unspecified: Secondary | ICD-10-CM | POA: Insufficient documentation

## 2018-06-04 MED ORDER — MEDROXYPROGESTERONE ACETATE 10 MG PO TABS: 10.0000 mg | ORAL_TABLET | Freq: Every day | ORAL | 0 refills | Status: DC

## 2018-06-04 MED ORDER — BUPROPION HCL ER (XL) 150 MG PO TB24: 150.0000 mg | ORAL_TABLET | Freq: Every day | ORAL | 1 refills | Status: DC

## 2018-06-04 NOTE — Patient Instructions (Signed)
I value your feedback and entrusting us with your care. If you get a [place] patient survey, I would appreciate you taking the time to let us know about your experience today. Thank you! 

## 2018-06-04 NOTE — Progress Notes (Signed)
Carren Rangarter, [name], PA-C   Chief Complaint  Patient presents with  . Breast exam     Hasnt had a physical since 2015 per pt, but [facility] around June, normal results     HPI:      [name] is a 27 y.o. G0P0000 who LMP was No LMP recorded. (Menstrual status: Irregular Periods)., presents today for breast symptoms. She complains of painful sores on breasts for years, sometimes they drain. Has similar sx bilat axilla, pubic area/inner thighs, along folds on flank. FH hx of same sx on pat side. Pt doesn't know what it is. Uses dial soap, tries to keep skin dry.   Pt has been amenorrheic for several yrs. Used provera in past for withdrawal bleed 2015. Had spontaneous menses over a year ago that lasted 26 days. No bleeding since. Pt is sex active with females. Used to have menses Q1-2 months. Hx of hypothyroidism, followed by PCP. Not controlled 2018. TSH WNL 2/19 but free T4 low. BMI>60.  Pt also has hx of HTN and takes HCTZ. Didn't take meds today and BP elevated.  Pt with anxiety/depression sx, no SI. Has worry, trouble relaxing, feeling depressed, trouble concentrating. Was started on wellbutrin 4/19 with PCP and took it for a month with some sx improvement. Moved to [place] and stopped meds. Sx recurred. Willing to restart it.   Pt states she [facility]. 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 Pt due for repeat pap. Doesn't have time for annual today.  Pt with pre-DM, taking metformin as preventive with PCP. BMI >60%.   Past Medical History:  Diagnosis Date  . Anxiety 2019  . Borderline diabetic   . Chronic bronchitis (HCC)   . COPD (chronic obstructive pulmonary disease) (HCC)   . Depression 2019  . Hypertension   . IBS (irritable bowel syndrome)   . Morbid obesity (HCC)   . Reflux   . Thyroid disease     Past Surgical History:  Procedure Laterality Date  . UPPER ENDOSCOPY W/ SCLEROTHERAPY      History  reviewed. No pertinent family history.  Current Outpatient Medications on File Prior to Visit  Medication Sig Dispense Refill  . albuterol (PROVENTIL HFA;VENTOLIN HFA) 108 (90 Base) MCG/ACT inhaler Inhale 2-4 puffs by mouth every 4 hours as needed for wheezing, cough, and/or shortness of breath 1 Inhaler 1  . hydrochlorothiazide (HYDRODIURIL) 25 MG tablet Take by mouth.    . hydrOXYzine (ATARAX/VISTARIL) 10 MG tablet Take 1 tablet (10 mg total) by mouth 3 (three) times daily as needed. 30 tablet 0  . ibuprofen (ADVIL,MOTRIN) 800 MG tablet Take 1 tablet (800 mg total) by mouth every 8 (eight) hours as needed for moderate pain. 15 tablet 0  . levothyroxine (SYNTHROID, LEVOTHROID) 25 MCG tablet Take by mouth.    . metFORMIN (GLUCOPHAGE) 500 MG tablet Take 500 mg by mouth 2 (two) times daily with a meal.    . naproxen (NAPROSYN) 500 MG tablet Take by mouth.    Marland Kitchen. omeprazole (PRILOSEC) 20 MG capsule Take 20 mg by mouth daily.    Marland Kitchen. Spacer/Aero Chamber Mouthpiece MISC 1 Units by Does not apply route every 4 (four) hours as needed (wheezing). 1 each 0   No current facility-administered medications on file prior to visit.      ROS:  Review of Systems  Constitutional: Negative for fever, malaise/fatigue and weight loss.  HENT: Negative  for congestion, ear pain and sinus pain.   Respiratory: Positive for shortness of breath and wheezing. Negative for cough.   Cardiovascular: Positive for chest pain. Negative for orthopnea and leg swelling.  Gastrointestinal: Positive for diarrhea. Negative for constipation, nausea and vomiting.  Genitourinary: Negative for dysuria, frequency, hematuria and urgency.          Musculoskeletal: Positive for myalgias. Negative for back pain and joint pain.  Skin: Positive for rash. Negative for itching.  Neurological: Negative for dizziness, tingling, focal weakness and headaches.  Endo/Heme/Allergies: Negative for environmental allergies. Does not bruise/bleed  easily.  Psychiatric/Behavioral: Positive for depression. Negative for suicidal ideas. The patient is not nervous/anxious and does not have insomnia.    Breast ROS: positive for - skin lesions   Objective: BP (!) 160/100   Pulse 98   Ht 5\' 2"  (1.575 m)   Wt (!) 340 lb (154.2 kg)   BMI 62.19 kg/m    Physical Exam  Constitutional: She is oriented to person, place, and time. She appears well-developed.  Neck: Normal range of motion.  Pulmonary/Chest: Effort normal. No respiratory distress.  Abdominal: Soft. She exhibits no distension. There is no tenderness.  Musculoskeletal: Normal range of motion.  Neurological: She is alert and oriented to person, place, and time. No cranial nerve deficit.  Skin: Skin is warm and dry. Lesion and rash noted. Rash is nodular and pustular. There is erythema.  DIFFUSE CYSTIC NODULES BILAT BREASTS; RESIDUAL SCARS BILAT FLANKS; C/W HS  Psychiatric: She has a normal mood and affect. Her behavior is normal. Thought content normal.  Vitals reviewed.  RESULTS: GAD 7 : Generalized Anxiety Score 06/04/2018  Nervous, Anxious, on Edge 2  Control/stop worrying 3  Worry too much - different things 3  Trouble relaxing 3  Restless 2  Easily annoyed or irritable 2  Afraid - awful might happen 0  Total GAD 7 Score 15  Anxiety Difficulty Very difficult     Depression screen PHQ 2/9 06/04/2018  Decreased Interest -  Down, Depressed, Hopeless 3  PHQ - 2 Score 3  Altered sleeping 3  Tired, decreased energy 3  Change in appetite 3  Feeling bad or failure about yourself  3  Trouble concentrating 2  Moving slowly or fidgety/restless 1  Suicidal thoughts 0  PHQ-9 Score 18  Difficult doing work/chores Very difficult     Assessment/Plan: Hidradenitis suppurativa - Breasts/flanks, and per pt report bilat axilla and pubic area. Dial soap/warm compresses. No need for abx today. Discussed derm ref (but pt losing ins).   Amenorrhea - Has female partner. Rx  provera. RTO in 2-3 wks for annual/f/u (pt losing ins 06/24/18). Most likely related to BMI. - Plan: medroxyPROGESTERone (PROVERA) 10 MG tablet  Anxiety and depression - Rx RF wellbutrin to restart. Pt to f/u with PCP for mgmt.  - Plan: buPROPion (WELLBUTRIN XL) 150 MG 24 hr tablet  LGSIL on Pap smear of cervix - Repeat pap at annual in 2 wks.   Elevated blood pressure reading in office with diagnosis of hypertension - Pt didn't take BP med today.   Meds ordered this encounter  Medications  . medroxyPROGESTERone (PROVERA) 10 MG tablet    Sig: Take 1 tablet (10 mg total) by mouth daily for 7 days.    Dispense:  7 tablet    Refill:  0    Order Specific Question:   Supervising Provider    Answer:   Nadara MustardHARRIS, ROBERT P [id number]]  . buPROPion (WELLBUTRIN XL)  150 MG 24 hr tablet    Sig: Take 1 tablet (150 mg total) by mouth daily.    Dispense:  30 tablet    Refill:  1    Order Specific Question:   Supervising Provider    Answer:   Nadara Mustard [[id number]]      F/U  Return in about 2 weeks (around 06/18/2018) for annual/f/u.  Katrina B. Copland, PA-C 06/04/2018 4:37 PM

## 2018-06-15 ENCOUNTER — Other Ambulatory Visit: Payer: Self-pay | Admitting: Obstetrics and Gynecology

## 2018-06-15 DIAGNOSIS — N912 Amenorrhea, unspecified: Secondary | ICD-10-CM

## 2018-06-17 ENCOUNTER — Ambulatory Visit (INDEPENDENT_AMBULATORY_CARE_PROVIDER_SITE_OTHER): Payer: Medicaid Other | Admitting: Obstetrics and Gynecology

## 2018-06-17 ENCOUNTER — Encounter: Payer: Self-pay | Admitting: Obstetrics and Gynecology

## 2018-06-17 ENCOUNTER — Other Ambulatory Visit (HOSPITAL_COMMUNITY)
Admission: RE | Admit: 2018-06-17 | Discharge: 2018-06-17 | Disposition: A | Discharge status: Home / Self Care | Payer: 59 | Source: Ambulatory Visit | Attending: Obstetrics and Gynecology | Admitting: Obstetrics and Gynecology

## 2018-06-17 VITALS — BP 180/100 | HR 103 | Ht 62.0 in | Wt 339.0 lb

## 2018-06-17 DIAGNOSIS — I10 Essential (primary) hypertension: Secondary | ICD-10-CM | POA: Insufficient documentation

## 2018-06-17 DIAGNOSIS — F329 Major depressive disorder, single episode, unspecified: Secondary | ICD-10-CM

## 2018-06-17 DIAGNOSIS — Z1151 Encounter for screening for human papillomavirus (HPV): Secondary | ICD-10-CM | POA: Diagnosis not present

## 2018-06-17 DIAGNOSIS — N912 Amenorrhea, unspecified: Secondary | ICD-10-CM

## 2018-06-17 DIAGNOSIS — Z113 Encounter for screening for infections with a predominantly sexual mode of transmission: Secondary | ICD-10-CM | POA: Insufficient documentation

## 2018-06-17 DIAGNOSIS — Z124 Encounter for screening for malignant neoplasm of cervix: Secondary | ICD-10-CM | POA: Diagnosis not present

## 2018-06-17 DIAGNOSIS — Z0000 Encounter for general adult medical examination without abnormal findings: Secondary | ICD-10-CM

## 2018-06-17 DIAGNOSIS — F419 Anxiety disorder, unspecified: Secondary | ICD-10-CM

## 2018-06-17 DIAGNOSIS — Z01419 Encounter for gynecological examination (general) (routine) without abnormal findings: Secondary | ICD-10-CM

## 2018-06-17 DIAGNOSIS — R87612 Low grade squamous intraepithelial lesion on cytologic smear of cervix (LGSIL): Secondary | ICD-10-CM

## 2018-06-17 NOTE — Patient Instructions (Signed)
I value your feedback and entrusting us with your care. If you get a [facility] patient survey, I would appreciate you taking the time to let us know about your experience today. Thank you! 

## 2018-06-17 NOTE — Progress Notes (Signed)
PCP:  Carren Rang, PA-C   Chief Complaint  Patient presents with  . Gynecologic Exam     HPI:      [name] is a 27 y.o. G0P0000 who LMP was No LMP recorded. (Menstrual status: Irregular Periods)., presents today for her annual examination.  Her menses are absent and she has been amenorrheic for several yrs. Used provera in past for withdrawal bleed 2015. Had spontaneous menses over a year ago that lasted 26 days. No bleeding since. Used to have menses Q1-2 months. Hx of hypothyroidism, followed by PCP. Not controlled 2018. TSH WNL 2/19 but free T4 low. BMI>60. Given provera 06/04/18 but hasn't started taking it yet. Plans to start it today.  Sex activity: has sex with females.   Last Pap: 7/18 at [facility]. 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 Pt due for repeat pap today. Hx of STDs: chlamydia  There is a FH of breast cancer in her mat aunt, genetic testing not indicated. There is no FH of ovarian cancer. The patient does not do self-breast exams.  Tobacco use: The patient currently smokes 1/4 packs of cigarettes per day for the past few years. Alcohol use: none No drug use.  Exercise: moderately active  She does not get adequate calcium and Vitamin D in her diet.  Pt also has hx of HTN and takes HCTZ. Hasn't taken meds recently due to getting Rx. Understands risk of elevated readings and need to take meds.   Pt with anxiety/depression sx, no SI. Has worry, trouble relaxing, feeling depressed, trouble concentrating. Was started on wellbutrin 4/19 with PCP and took it for a month with some sx improvement. Moved to [place] and stopped meds. Sx recurred. Willing to restart it and I sent in RF 8/19. Pt hasn't started meds yet.   Pt with pre-DM, taking metformin as preventive with PCP. BMI >60%.   Past Medical History:  Diagnosis Date  . Anxiety 2019  . Borderline diabetic   . Chronic bronchitis (HCC)   . COPD (chronic obstructive pulmonary disease) (HCC)    . Depression 2019  . Hypertension   . IBS (irritable bowel syndrome)   . Morbid obesity (HCC)   . Reflux   . Thyroid disease     Past Surgical History:  Procedure Laterality Date  . UPPER ENDOSCOPY W/ SCLEROTHERAPY      Family History  Problem Relation Age of Onset  . Diabetes Mellitus II Mother   . Diabetes Mellitus II Father   . Breast cancer Maternal Aunt 68    Social History   Socioeconomic History  . Marital status: Single    Spouse name: Not on file  . Number of children: Not on file  . Years of education: Not on file  . Highest education level: Not on file  Occupational History  . Not on file  Social Needs  . Financial resource strain: Not on file  . Food insecurity:    Worry: Not on file    Inability: Not on file  . Transportation needs:    Medical: Not on file    Non-medical: Not on file  Tobacco Use  . Smoking status: Current Every Day Smoker    Packs/day: 0.30    Types: Cigarettes  . Smokeless tobacco: Never Used  . Tobacco comment: has called 1-800-quit now and is decreasing smoking  Substance and Sexual Activity  . Alcohol use: No  . Drug use: No  . Sexual activity: Yes  Birth control/protection: None    Comment: female partner  Lifestyle  . Physical activity:    Days per week: Not on file    Minutes per session: Not on file  . Stress: Not on file  Relationships  . Social connections:    Talks on phone: Not on file    Gets together: Not on file    Attends religious service: Not on file    Active member of club or organization: Not on file    Attends meetings of clubs or organizations: Not on file    Relationship status: Not on file  . Intimate partner violence:    Fear of current or ex partner: Not on file    Emotionally abused: Not on file    Physically abused: Not on file    Forced sexual activity: Not on file  Other Topics Concern  . Not on file  Social History Narrative  . Not on file    Outpatient Medications Prior to  Visit  Medication Sig Dispense Refill  . albuterol (PROVENTIL HFA;VENTOLIN HFA) 108 (90 Base) MCG/ACT inhaler Inhale 2-4 puffs by mouth every 4 hours as needed for wheezing, cough, and/or shortness of breath 1 Inhaler 1  . buPROPion (WELLBUTRIN XL) 150 MG 24 hr tablet Take 1 tablet (150 mg total) by mouth daily. 30 tablet 1  . hydrochlorothiazide (HYDRODIURIL) 25 MG tablet Take by mouth.    Marland Kitchen ibuprofen (ADVIL,MOTRIN) 800 MG tablet Take 1 tablet (800 mg total) by mouth every 8 (eight) hours as needed for moderate pain. 15 tablet 0  . levothyroxine (SYNTHROID, LEVOTHROID) 25 MCG tablet Take by mouth.    . metFORMIN (GLUCOPHAGE-XR) 500 MG 24 hr tablet TAKE 2 TABLETS BY MOUTH EVERY DAY WITH DINNER  4  . naproxen (NAPROSYN) 500 MG tablet Take by mouth.    Marland Kitchen omeprazole (PRILOSEC) 20 MG capsule Take 20 mg by mouth daily.    Marland Kitchen Spacer/Aero Chamber Mouthpiece MISC 1 Units by Does not apply route every 4 (four) hours as needed (wheezing). 1 each 0  . Vitamin D, Ergocalciferol, (DRISDOL) 50000 units CAPS capsule Take 50,000 Units by mouth once a week.  1  . medroxyPROGESTERone (PROVERA) 10 MG tablet Take 1 tablet (10 mg total) by mouth daily for 7 days. 7 tablet 0  . hydrOXYzine (ATARAX/VISTARIL) 10 MG tablet Take 1 tablet (10 mg total) by mouth 3 (three) times daily as needed. 30 tablet 0  . metFORMIN (GLUCOPHAGE) 500 MG tablet Take 500 mg by mouth 2 (two) times daily with a meal.     No facility-administered medications prior to visit.     ROS:  Review of Systems  Constitutional: Negative for fatigue, fever and unexpected weight change.  Respiratory: Negative for cough, shortness of breath and wheezing.   Cardiovascular: Negative for chest pain, palpitations and leg swelling.  Gastrointestinal: Negative for blood in stool, constipation, diarrhea, nausea and vomiting.  Endocrine: Negative for cold intolerance, heat intolerance and polyuria.  Genitourinary: Negative for dyspareunia, dysuria, flank  pain, frequency, genital sores, hematuria, menstrual problem, pelvic pain, urgency, vaginal bleeding, vaginal discharge and vaginal pain.  Musculoskeletal: Negative for back pain, joint swelling and myalgias.  Skin: Negative for rash.  Neurological: Negative for dizziness, syncope, light-headedness, numbness and headaches.  Hematological: Negative for adenopathy.  Psychiatric/Behavioral: Negative for agitation, confusion, sleep disturbance and suicidal ideas. The patient is not nervous/anxious.    BREAST: No symptoms   Objective: BP (!) 180/100   Pulse (!) 103   Ht  5\' 2"  (1.575 m)   Wt (!) 339 lb (153.8 kg)   BMI 62.00 kg/m    Physical Exam  Constitutional: She is oriented to person, place, and time. She appears well-developed and well-nourished.  Genitourinary: Vagina normal and uterus normal. There is no rash or tenderness on the right labia. There is no rash or tenderness on the left labia. No erythema or tenderness in the vagina. No vaginal discharge found. Right adnexum does not display mass and does not display tenderness. Left adnexum does not display mass and does not display tenderness. Cervix does not exhibit motion tenderness or polyp. Uterus is not enlarged or tender.  Neck: Normal range of motion. No thyromegaly present.  Cardiovascular: Normal rate, regular rhythm and normal heart sounds.  No murmur heard. Pulmonary/Chest: Effort normal and breath sounds normal. Right breast exhibits no mass, no nipple discharge, no skin change and no tenderness. Left breast exhibits no mass, no nipple discharge, no skin change and no tenderness.  Abdominal: Soft. There is no tenderness. There is no guarding.  Musculoskeletal: Normal range of motion.  Neurological: She is alert and oriented to person, place, and time. No cranial nerve deficit.  Psychiatric: She has a normal mood and affect. Her behavior is normal.  Vitals reviewed.   Assessment/Plan: Encounter for annual routine  gynecological examination  Cervical cancer screening - Plan: Cytology - PAP  Screening for HPV (human papillomavirus) - Plan: Cytology - PAP  Screening for STD (sexually transmitted disease) - Plan: Cytology - PAP  LGSIL on Pap smear of cervix - Repeat pap due today. - Plan: Cytology - PAP  Amenorrhea - Hasn't taken provera yet. F/u if no withdrawal bleed. Will then need provera Q90 days if no spontaneous bleed. Pt declines BC.  Anxiety and depression - Has wellbutrin RF but hasn't started taking it yet. Will f/u wiht PCP for mgmt.  Elevated blood pressure reading in office with diagnosis of hypertension - Not taking BP meds. Just got Rx RF. Aware of risks/dangers of BP readings.   F/u with PCP for multiple medical issues/mgmt.           GYN counsel adequate intake of calcium and vitamin D, diet and exercise     F/U  Return in about 1 year (around 06/18/2019).  Ki Luckman B. Aliviya Schoeller, PA-C 06/17/2018 11:43 AM

## 2018-06-20 LAB — CYTOLOGY - PAP
ADEQUACY: ABSENT — AB
Chlamydia: NEGATIVE
HPV (WINDOPATH): NOT DETECTED
Neisseria Gonorrhea: NEGATIVE
Trichomonas: NEGATIVE

## 2018-06-24 ENCOUNTER — Telehealth: Payer: Self-pay | Admitting: Obstetrics and Gynecology

## 2018-06-24 DIAGNOSIS — R87612 Low grade squamous intraepithelial lesion on cytologic smear of cervix (LGSIL): Secondary | ICD-10-CM

## 2018-06-24 NOTE — Telephone Encounter (Signed)
Pt aware of LGSIL and need for colpo/bx. Had same results 7/18 and had colpo/bx at [facility], Dr. Elesa Massed.  Pt lost health insurance today due to age 27. Will refer to [facility] program for colpo coverage.

## 2018-07-14 ENCOUNTER — Ambulatory Visit: Payer: 59 | Admitting: Obstetrics & Gynecology

## 2018-07-14 ENCOUNTER — Other Ambulatory Visit: Payer: Self-pay

## 2018-07-14 ENCOUNTER — Ambulatory Visit: Payer: 59 | Attending: Oncology

## 2018-07-14 DIAGNOSIS — R87612 Low grade squamous intraepithelial lesion on cytologic smear of cervix (LGSIL): Secondary | ICD-10-CM

## 2018-07-14 NOTE — Progress Notes (Signed)
Reviewed LSIL pap results with patient.  Scheduled for colposcopy/biopsy with Dr. Tiburcio Pea at [facility] on 07/28/18.  Patient reports she has had procedure in the past.  Instructed her to discuss HPV vaccine that she received a a child, with Dr. Tiburcio Pea.

## 2018-07-28 ENCOUNTER — Other Ambulatory Visit (HOSPITAL_COMMUNITY)
Admission: RE | Admit: 2018-07-28 | Discharge: 2018-07-28 | Disposition: A | Discharge status: Home / Self Care | Payer: 59 | Source: Ambulatory Visit | Attending: Obstetrics & Gynecology | Admitting: Obstetrics & Gynecology

## 2018-07-28 ENCOUNTER — Encounter: Payer: Self-pay | Admitting: Obstetrics & Gynecology

## 2018-07-28 ENCOUNTER — Ambulatory Visit (INDEPENDENT_AMBULATORY_CARE_PROVIDER_SITE_OTHER): Payer: Self-pay | Admitting: Obstetrics & Gynecology

## 2018-07-28 VITALS — BP 140/80 | Ht 62.0 in | Wt 335.0 lb

## 2018-07-28 DIAGNOSIS — R87612 Low grade squamous intraepithelial lesion on cytologic smear of cervix (LGSIL): Secondary | ICD-10-CM

## 2018-07-28 NOTE — Progress Notes (Signed)
Referring Provider:  BCCCP, also PCP Carren Rang, PA  HPI:  Katrina Meyers is a 27 y.o.  G0P0000  who presents today for evaluation and management of abnormal cervical cytology.    Dysplasia History:  Recent LGSIL.  ALso LGSIL 2018.  ROS:  Pertinent items are noted in HPI.  OB History  Gravida Para Term Preterm AB Living  0 0 0 0 0 0  SAB TAB Ectopic Multiple Live Births  0 0 0 0 0   Past Medical History:  Diagnosis Date  . Anxiety 2019  . Borderline diabetic   . Chronic bronchitis (HCC)   . COPD (chronic obstructive pulmonary disease) (HCC)   . Depression 2019  . Hypertension   . IBS (irritable bowel syndrome)   . Morbid obesity (HCC)   . Reflux   . Thyroid disease    Past Surgical History:  Procedure Laterality Date  . UPPER ENDOSCOPY W/ SCLEROTHERAPY     tance and Sexual Activity  Drug Use No   Family History  Problem Relation Age of Onset  . Diabetes Mellitus II Mother   . Diabetes Mellitus II Father   . Breast cancer Maternal Aunt 65   SOCIAL HISTORY: Denies tob and EtOH use  Social History   Substance and Sexual Activity  Alcohol Use No    Social History    ALLERGIES:  Reglan [metoclopramide]  Current Outpatient Medications on File Prior to Visit  Medication Sig Dispense Refill  . albuterol (PROVENTIL HFA;VENTOLIN HFA) 108 (90 Base) MCG/ACT inhaler Inhale 2-4 puffs by mouth every 4 hours as needed for wheezing, cough, and/or shortness of breath 1 Inhaler 1  . buPROPion (WELLBUTRIN XL) 150 MG 24 hr tablet Take 1 tablet (150 mg total) by mouth daily. 30 tablet 1  . hydrochlorothiazide (HYDRODIURIL) 25 MG tablet Take by mouth.    Marland Kitchen ibuprofen (ADVIL,MOTRIN) 800 MG tablet Take 1 tablet (800 mg total) by mouth every 8 (eight) hours as needed for moderate pain. 15 tablet 0  . levothyroxine (SYNTHROID, LEVOTHROID) 25 MCG tablet Take by mouth.    . metFORMIN (GLUCOPHAGE-XR) 500 MG 24 hr tablet TAKE 2 TABLETS BY MOUTH EVERY DAY WITH DINNER  4  .  naproxen (NAPROSYN) 500 MG tablet Take by mouth.    Marland Kitchen omeprazole (PRILOSEC) 20 MG capsule Take 20 mg by mouth daily.    Marland Kitchen Spacer/Aero Chamber Mouthpiece MISC 1 Units by Does not apply route every 4 (four) hours as needed (wheezing). 1 each 0  . Vitamin D, Ergocalciferol, (DRISDOL) 50000 units CAPS capsule Take 50,000 Units by mouth once a week.  1  . medroxyPROGESTERone (PROVERA) 10 MG tablet Take 1 tablet (10 mg total) by mouth daily for 7 days. 7 tablet 0   No current facility-administered medications on file prior to visit.    Physical Exam: -Vitals:  BP 140/80   Ht 5\' 2"  (1.575 m)   Wt (!) 335 lb (152 kg)   BMI 61.27 kg/m   GEN: WD, WN, NAD.  A+ O x 3, good mood and affect. ABD:  NT, ND.  Soft, no masses.  No hernias noted.   Pelvic:   Vulva: Normal appearance.  No lesions.  Vagina: No lesions or abnormalities noted.  Support: Normal pelvic support.  Urethra No masses tenderness or scarring.  Meatus Normal size without lesions or prolapse.  Cervix: See below.  Anus: Normal exam.  No lesions.  Perineum: Normal exam.  No lesions.  Bimanual   Uterus: Normal size.  Non-tender.  Mobile.  AV.  Adnexae: No masses.  Non-tender to palpation.  Cul-de-sac: Negative for abnormality.   PROCEDURE: 1.  Urine Pregnancy Test:  not done 2.  Colposcopy performed with 4% acetic acid after verbal consent obtained                                        -Aceto-white Lesions Location(s): none.              -Biopsy performed at 12 o'clock               -ECC indicated and performed: No.     -Biopsy sites made hemostatic with pressure, AgNO3, and/or Monsel's solution   -Satisfactory colposcopy: Yes.      -Evidence of Invasive cervical CA :  NO  - Poor tolerence to procedure due to pain on spec exam  ASSESSMENT:  Katrina Meyers is a 27 y.o.G0P0000 here for LGSIL.  PLAN: 1.  I discussed the grading system of pap smears and HPV high risk viral types.  We will discuss and base management  after colpo results return. 2. Follow up PAP 6 months, vs intervention if high grade dysplasia identified 3. Treatment of persistantly abnormal PAP smears and cervical dysplasia, even mild, is discussed w pt today in detail, as well as the pros and cons of Cryo and LEEP procedures. Will consider and discuss after results.   Annamarie Major, MD, [facility], [facility] 07/28/2018  10:14 AM

## 2018-07-28 NOTE — Patient Instructions (Signed)

## 2018-07-28 NOTE — Addendum Note (Signed)
Addended by: Nadara Mustard on: 07/28/2018 01:37 PM   Modules accepted: Orders

## 2018-09-17 NOTE — Progress Notes (Signed)
Patient had a benign cervical biopsy on 07/28/18 with Dr. Tiburcio PeaHarris.  She his scheduled for a six month follow-up   BCCCP pap on 01/27/18 .  Mailed  appointment info to patient and Dr. Tiburcio PeaHarris notified.

## 2018-09-30 ENCOUNTER — Emergency Department
Admission: EM | Admit: 2018-09-30 | Discharge: 2018-09-30 | Disposition: A | Discharge status: Home / Self Care | Payer: 59 | Attending: Emergency Medicine | Admitting: Emergency Medicine

## 2018-09-30 ENCOUNTER — Encounter: Payer: Self-pay | Admitting: Emergency Medicine

## 2018-09-30 DIAGNOSIS — F17210 Nicotine dependence, cigarettes, uncomplicated: Secondary | ICD-10-CM | POA: Insufficient documentation

## 2018-09-30 DIAGNOSIS — I10 Essential (primary) hypertension: Secondary | ICD-10-CM | POA: Insufficient documentation

## 2018-09-30 DIAGNOSIS — E039 Hypothyroidism, unspecified: Secondary | ICD-10-CM | POA: Insufficient documentation

## 2018-09-30 DIAGNOSIS — M2550 Pain in unspecified joint: Secondary | ICD-10-CM

## 2018-09-30 DIAGNOSIS — R739 Hyperglycemia, unspecified: Secondary | ICD-10-CM

## 2018-09-30 DIAGNOSIS — J441 Chronic obstructive pulmonary disease with (acute) exacerbation: Secondary | ICD-10-CM | POA: Insufficient documentation

## 2018-09-30 LAB — URINALYSIS, COMPLETE (UACMP) WITH MICROSCOPIC
Bacteria, UA: NONE SEEN
Bilirubin Urine: NEGATIVE
GLUCOSE, UA: NEGATIVE mg/dL
Hgb urine dipstick: NEGATIVE
KETONES UR: NEGATIVE mg/dL
LEUKOCYTES UA: NEGATIVE
NITRITE: NEGATIVE
PH: 6 (ref 5.0–8.0)
Protein, ur: >=300 mg/dL — AB
Specific Gravity, Urine: 1.023 (ref 1.005–1.030)

## 2018-09-30 LAB — TSH: TSH: 10.786 u[IU]/mL — ABNORMAL HIGH (ref 0.350–4.500)

## 2018-09-30 LAB — COMPREHENSIVE METABOLIC PANEL
ALT: 21 U/L (ref 0–44)
AST: 22 U/L (ref 15–41)
Albumin: 3.3 g/dL — ABNORMAL LOW (ref 3.5–5.0)
Alkaline Phosphatase: 99 U/L (ref 38–126)
Anion gap: 8 (ref 5–15)
BUN: 12 mg/dL (ref 6–20)
CHLORIDE: 101 mmol/L (ref 98–111)
CO2: 26 mmol/L (ref 22–32)
Calcium: 8.5 mg/dL — ABNORMAL LOW (ref 8.9–10.3)
Creatinine, Ser: 0.78 mg/dL (ref 0.44–1.00)
GFR calc Af Amer: >60 mL/min (ref >60)
Glucose, Bld: 116 mg/dL — ABNORMAL HIGH (ref 70–99)
POTASSIUM: 4 mmol/L (ref 3.5–5.1)
Sodium: 135 mmol/L (ref 135–145)
Total Bilirubin: 0.5 mg/dL (ref 0.3–1.2)
Total Protein: 8.5 g/dL — ABNORMAL HIGH (ref 6.5–8.1)

## 2018-09-30 LAB — CBC
HCT: 40.3 % (ref 36.0–46.0)
Hemoglobin: 12.3 g/dL (ref 12.0–15.0)
MCH: 24.5 pg — ABNORMAL LOW (ref 26.0–34.0)
MCHC: 30.5 g/dL (ref 30.0–36.0)
MCV: 80.1 fL (ref 80.0–100.0)
NRBC: 0 % (ref 0.0–0.2)
PLATELETS: 309 10*3/uL (ref 150–400)
RBC: 5.03 MIL/uL (ref 3.87–5.11)
RDW: 15.1 % (ref 11.5–15.5)
WBC: 10.2 10*3/uL (ref 4.0–10.5)

## 2018-09-30 LAB — CK: CK TOTAL: 106 U/L (ref 38–234)

## 2018-09-30 LAB — POCT PREGNANCY, URINE: Preg Test, Ur: NEGATIVE

## 2018-09-30 MED ORDER — HYDROCHLOROTHIAZIDE 25 MG PO TABS: 25.0000 mg | ORAL_TABLET | Freq: Every day | ORAL | 0 refills | Status: DC

## 2018-09-30 MED ORDER — METFORMIN HCL ER 500 MG PO TB24
500.0000 mg | ORAL_TABLET | Freq: Every day | ORAL | 0 refills | Status: DC
Start: 2018-09-30 — End: 2020-01-14

## 2018-09-30 MED ORDER — LEVOTHYROXINE SODIUM 25 MCG PO TABS: 25.0000 ug | ORAL_TABLET | Freq: Every day | ORAL | 0 refills | Status: DC

## 2018-09-30 NOTE — ED Triage Notes (Signed)
Pt called for triage no response ?

## 2018-09-30 NOTE — Discharge Instructions (Signed)
Please restart your medications, and establish care with a primary care physician who can manage all of your chronic illnesses.  Return to the emergency department if you develop severe pain, lightheadedness or fainting, fever, inability to keep down fluids, or any other symptoms concerning to you.

## 2018-09-30 NOTE — ED Provider Notes (Signed)
[facility] Emergency Department Provider Note  ____________________________________________  Time seen: Approximately 10:53 AM  I have reviewed the triage vital signs and the nursing notes.   HISTORY  Chief Complaint Muscle Pain    HPI Katrina Meyers is a 27 y.o. female with a history of borderline diabetes, hypertension, morbid obesity, hypothyroidism, off of all of her medications presenting with joint aches.  The patient reports that since September, she has not taken any of her medications due to loss of her medical insurance.  She describes that the symptoms that bothers her the most is daily achy joints, "like someone is man handling me."  She denies any trauma, chest pain or shortness of breath, lightheadedness or syncope.  No fevers or chills.  Family history is positive for multiple sclerosis in her father.  Past Medical History:  Diagnosis Date  . Anxiety 2019  . Borderline diabetic   . Chronic bronchitis (HCC)   . COPD (chronic obstructive pulmonary disease) (HCC)   . Depression 2019  . Hypertension   . IBS (irritable bowel syndrome)   . Morbid obesity (HCC)   . Reflux   . Thyroid disease     Patient Active Problem List   Diagnosis Date Noted  . Elevated blood pressure reading in office with diagnosis of hypertension 06/17/2018  . Hidradenitis suppurativa 06/04/2018  . Anxiety and depression 06/04/2018  . Amenorrhea 06/04/2018  . Class 3 severe obesity due to excess calories with serious comorbidity and body mass index (BMI) of 50.0 to 59.9 in adult (HCC) 09/10/2017  . Gastroesophageal reflux disease without esophagitis 09/10/2017  . Irritable bowel syndrome 09/10/2017  . Prediabetes 09/10/2017  . HTN (hypertension) 05/28/2017  . LGSIL on Pap smear of cervix 10/15/2016  . Hypothyroidism, adult 06/28/2016  . Extremity pain 03/26/2014    Past Surgical History:  Procedure Laterality Date  . UPPER ENDOSCOPY W/ SCLEROTHERAPY       Current Outpatient Rx  . Order #: [id number] Class: Print  . Order #: [id number] Class: Normal  . Order #: [id number] Class: Print  . Order #: [id number] Class: Print  . Order #: [id number] Class: Print  . Order #: [id number] Class: Normal  . Order #: [id number] Class: Print  . Order #: [id number] Class: Historical Med  . Order #: [id number] Class: Historical Med  . Order #: [id number] Class: Print  . Order #: [id number] Class: Historical Med    Allergies Reglan [metoclopramide]  Family History  Problem Relation Age of Onset  . Diabetes Mellitus II Mother   . Diabetes Mellitus II Father   . Breast cancer Maternal Aunt 10    Social History Social History   Tobacco Use  . Smoking status: Current Every Day Smoker    Packs/day: 0.30    Types: Cigarettes  . Smokeless tobacco: Never Used  . Tobacco comment: has called 1-800-quit now and is decreasing smoking  Substance Use Topics  . Alcohol use: No  . Drug use: No    Review of Systems Constitutional: No fever/chills.  No lightheadedness or syncope.  Positive diffuse joint aches. Eyes: No visual changes. ENT: No sore throat. No congestion or rhinorrhea.  No goiter. Cardiovascular: Denies chest pain. Denies palpitations. Respiratory: Denies shortness of breath.  No cough. Gastrointestinal: No abdominal pain.  No nausea, no vomiting.  No diarrhea.  No constipation. Genitourinary: Negative for dysuria. Musculoskeletal: Negative for back pain. Skin: Negative for rash. Neurological: Negative for headaches. No focal numbness, tingling or weakness.  Endocrine:Has diabetes but has not been taking her metformin. Psych: Increased anxiety  ____________________________________________   PHYSICAL EXAM:  VITAL SIGNS: ED Triage Vitals  Enc Vitals Group     BP 09/30/18 0935 (!) 176/113     Pulse Rate 09/30/18 0935 98     Resp 09/30/18 0935 20     Temp 09/30/18 0935 98.7 F (37.1 C)     Temp Source 09/30/18 0935 Oral     SpO2 09/30/18  0935 96 %     Weight 09/30/18 0936 (!) 344 lb 9.3 oz (156.3 kg)     Height 09/30/18 0936 5\' 2"  (1.575 m)     Head Circumference --      Peak Flow --      Pain Score 09/30/18 0941 10     Pain Loc --      Pain Edu? --      Excl. in GC? --     Constitutional: Alert and oriented. Marland Kitchen. Answers questions appropriately. Eyes: Conjunctivae are normal.  EOMI. No scleral icterus. Head: Atraumatic. Nose: No congestion/rhinnorhea. Mouth/Throat: Mucous membranes are moist.  Neck: No stridor.  Supple.  No JVD.  No meningismus.  Positive linea nigracans.  No palpable goiter. Cardiovascular: Normal rate, regular rhythm. No murmurs, rubs or gallops.  Respiratory: Normal respiratory effort.  No accessory muscle use or retractions. Lungs CTAB.  No wheezes, rales or ronchi. Gastrointestinal: Obese.  Soft, nontender and nondistended.  No guarding or rebound.  No peritoneal signs. Musculoskeletal: No LE edema.  The patient is able to range all of her extremities, including hips, elbows, wrists, shoulders, knees and ankles without difficulty.  She has normal radial pulses and cap refill is less than 2 seconds. Neurologic:  A&Ox3.  Speech is clear.  Face and smile are symmetric.  EOMI.  Moves all extremities well. Skin:  Skin is warm, dry and intact. No rash noted. Psychiatric: Mood and affect are normal. Speech and behavior are normal.  Normal judgement.  ____________________________________________   LABS (all labs ordered are listed, but only abnormal results are displayed)  Labs Reviewed  TSH - Abnormal; Notable for the following components:      Result Value   TSH 10.786 (*)    All other components within normal limits  CBC - Abnormal; Notable for the following components:   MCH 24.5 (*)    All other components within normal limits  COMPREHENSIVE METABOLIC PANEL - Abnormal; Notable for the following components:   Glucose, Bld 116 (*)    Calcium 8.5 (*)    Total Protein 8.5 (*)    Albumin 3.3 (*)     All other components within normal limits  CK  URINALYSIS, COMPLETE (UACMP) WITH MICROSCOPIC  POC URINE PREG, ED   ____________________________________________  EKG  Not indicated ____________________________________________  RADIOLOGY  No results found.  ____________________________________________   PROCEDURES  Procedure(s) performed: None  Procedures  Critical Care performed: No ____________________________________________   INITIAL IMPRESSION / ASSESSMENT AND PLAN / ED COURSE  Pertinent labs & imaging results that were available during my care of the patient were reviewed by me and considered in my medical decision making (see chart for details).  27 y.o. female with multiple chronic medical conditions who has not been taking her medications for the past 3 months presenting with body aches.  Overall, the patient is hypertensive here with both systolic and diastolic elevated blood pressures.  She is supposed to be on HCTZ and has not taken this, so we will give her a new prescription for that.  I do not see any evidence  of acute trauma or infection in her joints.  She may have achy joints due to obesity, but also needs to be evaluated for possible autoimmune disease, given her personal and family history.  I have talked to her about rheumatoid arthritis and other autoimmune pathologies, that we will need evaluation by a primary care physician.  I will plan to discharge the patient home with her antihypertensive, antihyperglycemic, and thyroid supplementation.  She does have an elevated TSH today, consistent with her hypothyroidism but no evidence of an acute emergent thyroid pathology.  I have given her the number for 3 separate clinics in [place] which attend patients that do not have medical insurance.  Follow-up instructions as well as return precautions were discussed.  ____________________________________________  FINAL CLINICAL IMPRESSION(S) / ED  DIAGNOSES  Final diagnoses:  Arthralgia, unspecified joint  Essential hypertension  Hyperglycemia  Hypothyroidism, unspecified type         NEW MEDICATIONS STARTED DURING THIS VISIT:  Current Discharge Medication List        Rockne Menghini, MD 09/30/18 1058

## 2018-09-30 NOTE — ED Notes (Signed)
Pt states she ran out of her thyroid medication in September and has not been able to get her prescription filled since then

## 2018-09-30 NOTE — ED Triage Notes (Signed)
Pt reports pain to her muscles for the past few weeks. Pt denies history of the same. Pt reports pain is in hips, shoulders, knees, ets. Pt reports hx of hypothyoidism but has not had her medication since September. Pt reports would like a full work up and to be checked for Hashimoto's and other things that could present from hypothyroidism.

## 2019-01-28 ENCOUNTER — Ambulatory Visit: Payer: 59

## 2019-02-19 ENCOUNTER — Encounter: Payer: Self-pay | Admitting: Obstetrics & Gynecology

## 2019-02-19 ENCOUNTER — Ambulatory Visit (INDEPENDENT_AMBULATORY_CARE_PROVIDER_SITE_OTHER): Payer: Medicaid Other | Admitting: Obstetrics & Gynecology

## 2019-02-19 ENCOUNTER — Other Ambulatory Visit: Payer: Self-pay

## 2019-02-19 ENCOUNTER — Other Ambulatory Visit (HOSPITAL_COMMUNITY)
Admission: RE | Admit: 2019-02-19 | Discharge: 2019-02-19 | Disposition: A | Discharge status: Home / Self Care | Payer: 59 | Source: Ambulatory Visit | Attending: Obstetrics & Gynecology | Admitting: Obstetrics & Gynecology

## 2019-02-19 VITALS — BP 140/98 | Ht 62.0 in | Wt 359.0 lb

## 2019-02-19 DIAGNOSIS — R87612 Low grade squamous intraepithelial lesion on cytologic smear of cervix (LGSIL): Secondary | ICD-10-CM

## 2019-02-19 DIAGNOSIS — N911 Secondary amenorrhea: Secondary | ICD-10-CM

## 2019-02-19 MED ORDER — MEDROXYPROGESTERONE ACETATE 10 MG PO TABS: 10.0000 mg | ORAL_TABLET | Freq: Every day | ORAL | 3 refills | Status: DC

## 2019-02-19 NOTE — Progress Notes (Signed)
HPI:  Patient is a 28 y.o. G0P0000 presenting for follow up evaluation of abnormal PAP smear in the past.  Her last PAP was 8 months ago and was abnormal: LGSIL. She has had a prior colposcopy. Prior biopsies (if done) were Normal in 07/2018.  Also reports amenorrhea; provera in past to induce periods; none since 9 2019.  Weight gain this winter noted.  PMHx: She  has a past medical history of Anxiety (2019), Borderline diabetic, Chronic bronchitis (HCC), COPD (chronic obstructive pulmonary disease) (HCC), Depression (2019), Hypertension, IBS (irritable bowel syndrome), Morbid obesity (HCC), Reflux, and Thyroid disease. Also,  has a past surgical history that includes Upper endoscopy w/ sclerotherapy., family history includes Breast cancer (age of onset: 44) in her maternal aunt; Diabetes Mellitus II in her father and mother.,  reports that she has been smoking cigarettes. She has been smoking about 0.30 packs per day. She has never used smokeless tobacco. She reports that she does not drink alcohol or use drugs.  She has a current medication list which includes the following prescription(s): albuterol, bupropion, hydrochlorothiazide, ibuprofen, levothyroxine, medroxyprogesterone, metformin, naproxen, omeprazole, spacer/aero chamber mouthpiece, and vitamin d (ergocalciferol). Also, is allergic to reglan [metoclopramide].  Review of Systems  Constitutional: Negative for chills, fever and malaise/fatigue.  HENT: Negative for congestion, sinus pain and sore throat.   Eyes: Negative for blurred vision and pain.  Respiratory: Negative for cough and wheezing.   Cardiovascular: Negative for chest pain and leg swelling.  Gastrointestinal: Negative for abdominal pain, constipation, diarrhea, heartburn, nausea and vomiting.  Genitourinary: Negative for dysuria, frequency, hematuria and urgency.  Musculoskeletal: Negative for back pain, joint pain, myalgias and neck pain.  Skin: Negative for itching and  rash.  Neurological: Negative for dizziness, tremors and weakness.  Endo/Heme/Allergies: Does not bruise/bleed easily.  Psychiatric/Behavioral: Negative for depression. The patient is not nervous/anxious and does not have insomnia.     Objective: BP (!) 140/98   Ht 5\' 2"  (1.575 m)   Wt (!) 359 lb (162.8 kg)   BMI 65.66 kg/m  Filed Weights   02/19/19 1448  Weight: (!) 359 lb (162.8 kg)   Body mass index is 65.66 kg/m.  Physical examination Physical Exam Constitutional:      General: She is not in acute distress.    Appearance: She is well-developed.  Genitourinary:     Pelvic exam was performed with patient supine.     Vagina and uterus normal.     No vaginal erythema or bleeding.     No cervical motion tenderness, discharge, polyp or nabothian cyst.     Uterus is mobile.     Uterus is not enlarged.     No uterine mass detected.    Uterus is midaxial.     No right or left adnexal mass present.     Right adnexa not tender.     Left adnexa not tender.  HENT:     Head: Normocephalic and atraumatic.     Nose: Nose normal.  Abdominal:     General: There is no distension.     Palpations: Abdomen is soft.     Tenderness: There is no abdominal tenderness.  Musculoskeletal: Normal range of motion.  Neurological:     Mental Status: She is alert and oriented to person, place, and time.     Cranial Nerves: No cranial nerve deficit.  Skin:    General: Skin is warm and dry.     ASSESSMENT:  History of Cervical Dysplasia 1. LGSIL  on Pap smear of cervix - Cytology - PAP  2. Amenorrhea, secondary  Plan:  1.  I discussed the grading system of pap smears and HPV high risk viral types.   2. Follow up PAP 6 months, vs intervention if high grade dysplasia identified. 3. Treatment of persistantly abnormal PAP smears and cervical dysplasia, even mild, is discussed w pt today in detail, as well as the pros and cons of Cryo and LEEP procedures. Will consider and discuss after  results.  Also, Provera for period as needs to have 4 periods or more per year to protect against endometrial cancer.  Annamarie MajorPaul Lucina Betty, MD, Merlinda FrederickFACOG [facility], [facility] 02/19/2019  2:51 PM

## 2019-02-24 LAB — CYTOLOGY - PAP
Adequacy: ABSENT
Diagnosis: NEGATIVE

## 2019-09-18 ENCOUNTER — Emergency Department
Admission: EM | Admit: 2019-09-18 | Discharge: 2019-09-18 | Disposition: A | Discharge status: Home / Self Care | Payer: Medicaid Other | Attending: Emergency Medicine | Admitting: Emergency Medicine

## 2019-09-18 ENCOUNTER — Other Ambulatory Visit: Payer: Self-pay

## 2019-09-18 ENCOUNTER — Emergency Department: Payer: Medicaid Other

## 2019-09-18 DIAGNOSIS — J449 Chronic obstructive pulmonary disease, unspecified: Secondary | ICD-10-CM | POA: Diagnosis not present

## 2019-09-18 DIAGNOSIS — F17210 Nicotine dependence, cigarettes, uncomplicated: Secondary | ICD-10-CM | POA: Diagnosis not present

## 2019-09-18 DIAGNOSIS — R0789 Other chest pain: Secondary | ICD-10-CM | POA: Diagnosis not present

## 2019-09-18 DIAGNOSIS — Z79899 Other long term (current) drug therapy: Secondary | ICD-10-CM | POA: Insufficient documentation

## 2019-09-18 DIAGNOSIS — Z20828 Contact with and (suspected) exposure to other viral communicable diseases: Secondary | ICD-10-CM | POA: Insufficient documentation

## 2019-09-18 DIAGNOSIS — I10 Essential (primary) hypertension: Secondary | ICD-10-CM | POA: Diagnosis not present

## 2019-09-18 DIAGNOSIS — E039 Hypothyroidism, unspecified: Secondary | ICD-10-CM | POA: Insufficient documentation

## 2019-09-18 DIAGNOSIS — R079 Chest pain, unspecified: Secondary | ICD-10-CM

## 2019-09-18 LAB — TROPONIN I (HIGH SENSITIVITY): Troponin I (High Sensitivity): 6 ng/L (ref <18)

## 2019-09-18 LAB — BASIC METABOLIC PANEL
Anion gap: 11 (ref 5–15)
BUN: 9 mg/dL (ref 6–20)
CO2: 27 mmol/L (ref 22–32)
Calcium: 8.7 mg/dL — ABNORMAL LOW (ref 8.9–10.3)
Chloride: 100 mmol/L (ref 98–111)
Creatinine, Ser: 0.84 mg/dL (ref 0.44–1.00)
GFR calc Af Amer: >60 mL/min (ref >60)
GFR calc non Af Amer: >60 mL/min (ref >60)
Glucose, Bld: 92 mg/dL (ref 70–99)
Potassium: 3.5 mmol/L (ref 3.5–5.1)
Sodium: 138 mmol/L (ref 135–145)

## 2019-09-18 LAB — POC SARS CORONAVIRUS 2 AG: SARS Coronavirus 2 Ag: NEGATIVE

## 2019-09-18 LAB — CBC
HCT: 37.8 % (ref 36.0–46.0)
Hemoglobin: 12.5 g/dL (ref 12.0–15.0)
MCH: 25.6 pg — ABNORMAL LOW (ref 26.0–34.0)
MCHC: 33.1 g/dL (ref 30.0–36.0)
MCV: 77.3 fL — ABNORMAL LOW (ref 80.0–100.0)
Platelets: 254 10*3/uL (ref 150–400)
RBC: 4.89 MIL/uL (ref 3.87–5.11)
RDW: 14.9 % (ref 11.5–15.5)
WBC: 11 10*3/uL — ABNORMAL HIGH (ref 4.0–10.5)
nRBC: 0 % (ref 0.0–0.2)

## 2019-09-18 MED ORDER — CLONIDINE HCL 0.1 MG PO TABS
0.1000 mg | ORAL_TABLET | Freq: Once | ORAL | Status: AC
  Administered 2019-09-18: 0.1 mg via ORAL
  Filled 2019-09-18: qty 1

## 2019-09-18 MED ORDER — SODIUM CHLORIDE 0.9% FLUSH: 3.0000 mL | Freq: Once | INTRAVENOUS | Status: DC

## 2019-09-18 MED ORDER — AMLODIPINE BESYLATE 5 MG PO TABS: 5.0000 mg | ORAL_TABLET | Freq: Every day | ORAL | 2 refills | Status: DC

## 2019-09-18 MED ORDER — HYDROCHLOROTHIAZIDE 25 MG PO TABS
25.0000 mg | ORAL_TABLET | Freq: Once | ORAL | Status: AC
  Administered 2019-09-18: 25 mg via ORAL
  Filled 2019-09-18: qty 1

## 2019-09-18 MED ORDER — HYDROCHLOROTHIAZIDE 25 MG PO TABS: 25.0000 mg | ORAL_TABLET | Freq: Every day | ORAL | 2 refills | Status: DC

## 2019-09-18 NOTE — ED Triage Notes (Signed)
Pt comes via POV from home with c/o CP and SOB. Pt states also back pain and joint aches.  Pt states productive cough. Pt states mid sternal chest pain that radiates to arms .  Pt states it is worse when she takes deep breaths.

## 2019-09-18 NOTE — ED Provider Notes (Signed)
[facility] Emergency Department Provider Note  ____________________________________________  Time seen: Approximately 4:09 PM  The following is a medical screening exam note. It is intended that the patient await an ER room assignment for detailed exam, diagnosis, and disposition.  I have reviewed the triage vital signs.    HISTORY  Chief Complaint Chest Pain and Shortness of Breath   HPI Katrina Meyers is a 28 y.o. female that presents to the emergency department for evaluation of mid CP, back pain, productive cough, and body aches. Patient has not been taking her blood pressure medications because she has not had insurance to fill medications. She just got insurance. No contacts with COVID 19.   Past Medical History:  Diagnosis Date  . Anxiety 2019  . Borderline diabetic   . Chronic bronchitis (HCC)   . COPD (chronic obstructive pulmonary disease) (HCC)   . Depression 2019  . Hypertension   . IBS (irritable bowel syndrome)   . Morbid obesity (HCC)   . Reflux   . Thyroid disease     Patient Active Problem List   Diagnosis Date Noted  . Elevated blood pressure reading in office with diagnosis of hypertension 06/17/2018  . Hidradenitis suppurativa 06/04/2018  . Anxiety and depression 06/04/2018  . Amenorrhea, secondary 06/04/2018  . Class 3 severe obesity due to excess calories with serious comorbidity and body mass index (BMI) of 50.0 to 59.9 in adult (HCC) 09/10/2017  . Gastroesophageal reflux disease without esophagitis 09/10/2017  . Irritable bowel syndrome 09/10/2017  . Prediabetes 09/10/2017  . HTN (hypertension) 05/28/2017  . LGSIL on Pap smear of cervix 10/15/2016  . Hypothyroidism, adult 06/28/2016  . Extremity pain 03/26/2014    Past Surgical History:  Procedure Laterality Date  . UPPER ENDOSCOPY W/ SCLEROTHERAPY      Prior to Admission medications   Medication Sig Start Date End Date Taking? Authorizing Provider  albuterol  (PROVENTIL HFA;VENTOLIN HFA) 108 (90 Base) MCG/ACT inhaler Inhale 2-4 puffs by mouth every 4 hours as needed for wheezing, cough, and/or shortness of breath Patient not taking: Reported on 02/19/2019 10/09/17   Loleta Rose, MD  buPROPion (WELLBUTRIN XL) 150 MG 24 hr tablet Take 1 tablet (150 mg total) by mouth daily. Patient not taking: Reported on 02/19/2019 06/04/18 06/04/19  Copland, Ilona Sorrel, PA-C  hydrochlorothiazide (HYDRODIURIL) 25 MG tablet Take 1 tablet (25 mg total) by mouth daily. Patient not taking: Reported on 09/30/2018 09/30/18   Rockne Menghini, MD  ibuprofen (ADVIL,MOTRIN) 800 MG tablet Take 1 tablet (800 mg total) by mouth every 8 (eight) hours as needed for moderate pain. Patient not taking: Reported on 02/19/2019 12/27/17   Joni Reining, PA-C  levothyroxine (SYNTHROID, LEVOTHROID) 25 MCG tablet Take 1 tablet (25 mcg total) by mouth daily before breakfast. Patient not taking: Reported on 09/30/2018 09/30/18   Rockne Menghini, MD  medroxyPROGESTERone (PROVERA) 10 MG tablet Take 1 tablet (10 mg total) by mouth daily for 10 days. 02/19/19 03/01/19  Nadara Mustard, MD  metFORMIN (GLUCOPHAGE-XR) 500 MG 24 hr tablet Take 1 tablet (500 mg total) by mouth daily with breakfast. Patient not taking: Reported on 02/19/2019 09/30/18   Rockne Menghini, MD  naproxen (NAPROSYN) 500 MG tablet Take by mouth.    [provider]  omeprazole (PRILOSEC) 20 MG capsule Take 20 mg by mouth daily.    [provider]  Spacer/Aero Chamber Mouthpiece MISC 1 Units by Does not apply route every 4 (four) hours as needed (wheezing). Patient not  taking: Reported on 02/19/2019 09/09/17   Darel Hong, MD  Vitamin D, Ergocalciferol, (DRISDOL) 50000 units CAPS capsule Take 50,000 Units by mouth once a week. 06/05/18   [provider]    Allergies Reglan [metoclopramide]  Family History  Problem Relation Age of Onset  . Diabetes Mellitus II Mother   . Diabetes Mellitus II  Father   . Breast cancer Maternal Aunt 70    Social History Social History   Tobacco Use  . Smoking status: Current Every Day Smoker    Packs/day: 0.30    Types: Cigarettes  . Smokeless tobacco: Never Used  . Tobacco comment: has called 1-800-quit now and is decreasing smoking  Substance Use Topics  . Alcohol use: No  . Drug use: No    Review of Systems Constitutional:  for fever. Respiratory: Positive for cough. Gastrointestinal: No abdominal pain.  No nausea, no vomiting.  No diarrhea.  Musculoskeletal: Positive for generalized body aches. Skin: No for rash/lesion/wound. Neurological: Negative for headaches, focal weakness or numbness.  ____________________________________________   PHYSICAL EXAM:  VITAL SIGNS:  Vitals:   09/18/19 1900 09/18/19 1957  BP: (!) 206/107 (!) 189/103  Pulse: 86 85  Resp: 19 20  Temp:    SpO2: 98% 99%    Constitutional: Alert and oriented. No acute distress. Head: Atraumatic. Nose: No congestion/rhinnorhea. Mouth/Throat: Mucous membranes are moist. Neck: No stridor.  Cardiovascular: Good peripheral circulation. Respiratory: Normal respiratory effort. Musculoskeletal: No restriction Neurologic:  Normal speech and language. No gross focal neurologic deficits are appreciated. Speech is normal. No gait instability. Skin:  Skin is warm, dry and intact. No rash noted. Psychiatric: Mood and affect are normal. Speech and behavior are normal.  ____________________________________________   LABS (all labs ordered are listed, but only abnormal results are displayed)  Labs Reviewed - No data to display ____________________________________________  EKG  ____________________________________________   INITIAL CLINICAL IMPRESSION(S)   Patient is well appearing, in no acute distress. Her blood pressure is elevated. Basic labs, EKG, chest xray ordered.   Laban Emperor, PA-C 09/18/19 2209    Delman Kitten, MD 09/18/19 [phone]

## 2019-09-18 NOTE — ED Provider Notes (Signed)
[facility] Emergency Department Provider Note  Time seen: 6:40 PM  I have reviewed the triage vital signs and the nursing notes.   HISTORY  Chief Complaint Chest Pain and Shortness of Breath   HPI Katrina Meyers is a 28 y.o. female with a past medical history of anxiety, rhonchi-itis, COPD, hypertension, obesity, presents to the emergency department for chest pain.  According to the patient for the past month or so she has been experiencing generalized body aches, for the past week or so has been experiencing chest pain intermittently.  Denies any shortness of breath but does state a cough, but is a smoker and states not significantly worse than normal.  Denies any fevers, 99.5 in the emergency department.  Patient states she has been off all of her medications including her blood pressure medications for the past 1 year.  Blood pressure is currently quite elevated 180/120.   Past Medical History:  Diagnosis Date  . Anxiety 2019  . Borderline diabetic   . Chronic bronchitis (HCC)   . COPD (chronic obstructive pulmonary disease) (HCC)   . Depression 2019  . Hypertension   . IBS (irritable bowel syndrome)   . Morbid obesity (HCC)   . Reflux   . Thyroid disease     Patient Active Problem List   Diagnosis Date Noted  . Elevated blood pressure reading in office with diagnosis of hypertension 06/17/2018  . Hidradenitis suppurativa 06/04/2018  . Anxiety and depression 06/04/2018  . Amenorrhea, secondary 06/04/2018  . Class 3 severe obesity due to excess calories with serious comorbidity and body mass index (BMI) of 50.0 to 59.9 in adult (HCC) 09/10/2017  . Gastroesophageal reflux disease without esophagitis 09/10/2017  . Irritable bowel syndrome 09/10/2017  . Prediabetes 09/10/2017  . HTN (hypertension) 05/28/2017  . LGSIL on Pap smear of cervix 10/15/2016  . Hypothyroidism, adult 06/28/2016  . Extremity pain 03/26/2014    Past Surgical History:   Procedure Laterality Date  . UPPER ENDOSCOPY W/ SCLEROTHERAPY      Prior to Admission medications   Medication Sig Start Date End Date Taking? Authorizing Provider  albuterol (PROVENTIL HFA;VENTOLIN HFA) 108 (90 Base) MCG/ACT inhaler Inhale 2-4 puffs by mouth every 4 hours as needed for wheezing, cough, and/or shortness of breath Patient not taking: Reported on 02/19/2019 10/09/17   Loleta RoseForbach, Cory, MD  buPROPion (WELLBUTRIN XL) 150 MG 24 hr tablet Take 1 tablet (150 mg total) by mouth daily. Patient not taking: Reported on 02/19/2019 06/04/18 06/04/19  Copland, Ilona SorrelAlicia B, PA-C  hydrochlorothiazide (HYDRODIURIL) 25 MG tablet Take 1 tablet (25 mg total) by mouth daily. Patient not taking: Reported on 09/30/2018 09/30/18   Rockne MenghiniNorman, Anne-Caroline, MD  ibuprofen (ADVIL,MOTRIN) 800 MG tablet Take 1 tablet (800 mg total) by mouth every 8 (eight) hours as needed for moderate pain. Patient not taking: Reported on 02/19/2019 12/27/17   Joni ReiningSmith, Ronald K, PA-C  levothyroxine (SYNTHROID, LEVOTHROID) 25 MCG tablet Take 1 tablet (25 mcg total) by mouth daily before breakfast. Patient not taking: Reported on 09/30/2018 09/30/18   Rockne MenghiniNorman, Anne-Caroline, MD  medroxyPROGESTERone (PROVERA) 10 MG tablet Take 1 tablet (10 mg total) by mouth daily for 10 days. 02/19/19 03/01/19  Nadara MustardHarris, Robert P, MD  metFORMIN (GLUCOPHAGE-XR) 500 MG 24 hr tablet Take 1 tablet (500 mg total) by mouth daily with breakfast. Patient not taking: Reported on 02/19/2019 09/30/18   Rockne MenghiniNorman, Anne-Caroline, MD  naproxen (NAPROSYN) 500 MG tablet Take by mouth.    [provider]  omeprazole (PRILOSEC) 20 MG capsule Take 20 mg by mouth daily.    [provider]  Spacer/Aero Chamber Mouthpiece MISC 1 Units by Does not apply route every 4 (four) hours as needed (wheezing). Patient not taking: Reported on 02/19/2019 09/09/17   Merrily Brittle, MD  Vitamin D, Ergocalciferol, (DRISDOL) 50000 units CAPS capsule Take 50,000 Units by mouth once a week.  06/05/18   [provider]    Allergies  Allergen Reactions  . Reglan [Metoclopramide] Other (See Comments)    Family History  Problem Relation Age of Onset  . Diabetes Mellitus II Mother   . Diabetes Mellitus II Father   . Breast cancer Maternal Aunt 78    Social History Social History   Tobacco Use  . Smoking status: Current Every Day Smoker    Packs/day: 0.30    Types: Cigarettes  . Smokeless tobacco: Never Used  . Tobacco comment: has called 1-800-quit now and is decreasing smoking  Substance Use Topics  . Alcohol use: No  . Drug use: No    Review of Systems Constitutional: Negative for fever. Cardiovascular: Intermittent chest pains x1 week Respiratory: Negative for shortness of breath.  Positive for occasional cough.  Chest pain somewhat worse with cough. Gastrointestinal: Negative for abdominal pain Musculoskeletal: Generalized body aches. Neurological: Negative for headache All other ROS negative  ____________________________________________   PHYSICAL EXAM:  VITAL SIGNS: ED Triage Vitals  Enc Vitals Group     BP 09/18/19 1611 (!) 201/119     Pulse Rate 09/18/19 1611 91     Resp 09/18/19 1611 19     Temp 09/18/19 1611 99.5 F (37.5 C)     Temp src --      SpO2 09/18/19 1611 99 %     Weight 09/18/19 1609 (!) 351 lb 9.6 oz (159.5 kg)     Height 09/18/19 1609 5\' 2"  (1.575 m)     Head Circumference --      Peak Flow --      Pain Score 09/18/19 1609 10     Pain Loc --      Pain Edu? --      Excl. in GC? --    Constitutional: Alert and oriented. Well appearing and in no distress. Eyes: Normal exam ENT      Head: Normocephalic and atraumatic.      Mouth/Throat: Mucous membranes are moist. Cardiovascular: Normal rate, regular rhythm. Respiratory: Normal respiratory effort without tachypnea nor retractions. Breath sounds are clear.  Moderate chest wall tenderness palpation. Gastrointestinal: Soft and nontender. No distention.    Musculoskeletal: Nontender with normal range of motion in all extremities.  Neurologic:  Normal speech and language. No gross focal neurologic deficits  Skin:  Skin is warm, dry and intact.  Psychiatric: Mood and affect are normal.  ____________________________________________    EKG  EKG viewed and interpreted by myself shows a normal sinus rhythm at 92 bpm with a narrow QRS, normal axis, normal intervals, no concerning ST changes.  ____________________________________________    RADIOLOGY  Chest x-ray shows chronic bronchitis without acute infiltrate  ____________________________________________   INITIAL IMPRESSION / ASSESSMENT AND PLAN / ED COURSE  Pertinent labs & imaging results that were available during my care of the patient were reviewed by me and considered in my medical decision making (see chart for details).   Patient presents to the emergency department for intermittent chest pain over the past 1 week, diffuse body aches x1 month, cough.  Patient does have a borderline  low temperature 99.5 in the emergency department we will perform a Covid swab as a precaution.  Patient's chest x-ray shows chronic changes without acute infiltrate.  Other work-up is largely nonrevealing including a reassuring EKG and a negative troponin.  Patient's Covid test is negative.  We will discharge patient on blood pressure medications, have her follow-up with a PCP to get reestablished and for ongoing medical care and home medications.  She is already scheduled an appointment but is not until the first week of January per patient.  Katrina Meyers was evaluated in Emergency Department on 09/18/2019 for the symptoms described in the history of present illness. She was evaluated in the context of the global COVID-19 pandemic, which necessitated consideration that the patient might be at risk for infection with the SARS-CoV-2 virus that causes COVID-19. Institutional protocols and algorithms that  pertain to the evaluation of patients at risk for COVID-19 are in a state of rapid change based on information released by regulatory bodies including the CDC and federal and state organizations. These policies and algorithms were followed during the patient's care in the ED.  ____________________________________________   FINAL CLINICAL IMPRESSION(S) / ED DIAGNOSES  Chest pain   Harvest Dark, MD 09/18/19 1940

## 2020-01-14 ENCOUNTER — Other Ambulatory Visit: Payer: Self-pay

## 2020-01-14 ENCOUNTER — Encounter: Payer: Self-pay | Admitting: Obstetrics and Gynecology

## 2020-01-14 ENCOUNTER — Ambulatory Visit (INDEPENDENT_AMBULATORY_CARE_PROVIDER_SITE_OTHER): Payer: Medicaid Other | Admitting: Obstetrics and Gynecology

## 2020-01-14 ENCOUNTER — Ambulatory Visit (INDEPENDENT_AMBULATORY_CARE_PROVIDER_SITE_OTHER): Payer: Medicaid Other

## 2020-01-14 VITALS — Ht 62.0 in | Wt 361.0 lb

## 2020-01-14 DIAGNOSIS — N912 Amenorrhea, unspecified: Secondary | ICD-10-CM

## 2020-01-14 DIAGNOSIS — N938 Other specified abnormal uterine and vaginal bleeding: Secondary | ICD-10-CM | POA: Diagnosis not present

## 2020-01-14 DIAGNOSIS — R102 Pelvic and perineal pain: Secondary | ICD-10-CM | POA: Diagnosis not present

## 2020-01-14 MED ORDER — NORETHINDRONE ACETATE 5 MG PO TABS: 5.0000 mg | ORAL_TABLET | Freq: Every day | ORAL | 0 refills | Status: DC

## 2020-01-14 MED ORDER — MEDROXYPROGESTERONE ACETATE 10 MG PO TABS: 10.0000 mg | ORAL_TABLET | Freq: Every day | ORAL | 0 refills | Status: DC

## 2020-01-14 NOTE — Patient Instructions (Signed)
I value your feedback and entrusting us with your care. If you get a [facility] patient survey, I would appreciate you taking the time to let us know about your experience today. Thank you!  As of September 24, 2019, your lab results will be released to your MyChart immediately, before I even have a chance to see them. Please give me time to review them and contact you if there are any abnormalities. Thank you for your patience.  

## 2020-01-14 NOTE — Progress Notes (Signed)
[place], [facility]   Chief Complaint  Patient presents with  . Menstrual Problem    pt has been on cycle since Jan 12, has always been irregular    HPI:      [name] is a 29 y.o. G0P0000 who LMP was Patient's last menstrual period was 10/27/2019 (exact date)., presents today for DUB. Long hx of amenorrhea. Seen 9/19 and given provera Q90 days due to no menses in over a yr. Pt has been doing provera fairly regularly but went with no menses/no provera from 12/2018-09/02/19. Took provera and bled from 09/03/19-10/17/19, stopped bleeding for 10 days, then started again 10/27/19 to present. Flow is mild to mod but passes large clots about 4 times daily. No dysmen but has pelvic pains in general. Pt not interested in [facility]. Hx of hypothyroidism, not controlled currently but seeing new endocrine and improving. Pt has been told importance of menses Q90 days for endometrial cancer risk prevention.  Hx of HTN and migraines. Pt would like to do bariatric surg but needs to get thyroid controlled first.  She is sex active with her wife, but not recently.  Last pap neg 02/19/19, repeat in 1 yr. Hx of LGSIL with colpo/ neg bx 10/19.   Past Medical History:  Diagnosis Date  . Anxiety 2019  . Borderline diabetic   . Chronic bronchitis (HCC)   . COPD (chronic obstructive pulmonary disease) (HCC)   . Depression 2019  . Hypertension   . IBS (irritable bowel syndrome)   . Morbid obesity (HCC)   . Reflux   . Thyroid disease     Past Surgical History:  Procedure Laterality Date  . UPPER ENDOSCOPY W/ SCLEROTHERAPY      Family History  Problem Relation Age of Onset  . Diabetes Mellitus II Mother   . Diabetes Mellitus II Father   . Breast cancer Maternal Aunt 52    Social History   Socioeconomic History  . Marital status: Single    Spouse name: Not on file  . Number of children: Not on file  . Years of education: Not on file  . Highest education level: Not on file    Occupational History  . Not on file  Tobacco Use  . Smoking status: Current Every Day Smoker    Packs/day: 0.30    Types: Cigarettes  . Smokeless tobacco: Never Used  . Tobacco comment: has called 1-800-quit now and is decreasing smoking  Substance and Sexual Activity  . Alcohol use: No  . Drug use: No  . Sexual activity: Yes    Birth control/protection: None    Comment: female partner  Other Topics Concern  . Not on file  Social History Narrative  . Not on file   Social Determinants of Health   Financial Resource Strain:   . Difficulty of Paying Living Expenses:   Food Insecurity:   . Worried About Programme researcher, broadcasting/film/video in the Last Year:   . Barista in the Last Year:   Transportation Needs:   . Freight forwarder (Medical):   Marland Kitchen Lack of Transportation (Non-Medical):   Physical Activity:   . Days of Exercise per Week:   . Minutes of Exercise per Session:   Stress:   . Feeling of Stress :   Social Connections:   . Frequency of Communication with Friends and Family:   . Frequency of Social Gatherings with Friends and Family:   . Attends Religious Services:   .  Active Member of Clubs or Organizations:   . Attends Banker Meetings:   Marland Kitchen Marital Status:   Intimate Partner Violence:   . Fear of Current or Ex-Partner:   . Emotionally Abused:   Marland Kitchen Physically Abused:   . Sexually Abused:     Outpatient Medications Prior to Visit  Medication Sig Dispense Refill  . albuterol (PROVENTIL HFA;VENTOLIN HFA) 108 (90 Base) MCG/ACT inhaler Inhale 2-4 puffs by mouth every 4 hours as needed for wheezing, cough, and/or shortness of breath 1 Inhaler 1  . amLODipine (NORVASC) 5 MG tablet Take 1 tablet (5 mg total) by mouth daily. 30 tablet 2  . Cholecalciferol 1.25 MG (50000 UT) capsule Take by mouth.    . hydrochlorothiazide (HYDRODIURIL) 25 MG tablet Take 1 tablet (25 mg total) by mouth daily. 30 tablet 2  . hydrOXYzine (ATARAX/VISTARIL) 25 MG tablet Take by  mouth.    . levothyroxine (SYNTHROID, LEVOTHROID) 25 MCG tablet Take 1 tablet (25 mcg total) by mouth daily before breakfast. 30 tablet 0  . metoprolol succinate (TOPROL-XL) 50 MG 24 hr tablet Take by mouth.    . mometasone-formoterol (DULERA) 200-5 MCG/ACT AERO Inhale into the lungs.    Marland Kitchen omeprazole (PRILOSEC) 20 MG capsule Take 20 mg by mouth daily.    Marland Kitchen Spacer/Aero Chamber Mouthpiece MISC 1 Units by Does not apply route every 4 (four) hours as needed (wheezing). 1 each 0  . Vitamin D, Ergocalciferol, (DRISDOL) 50000 units CAPS capsule Take 50,000 Units by mouth once a week.  1  . buPROPion (WELLBUTRIN XL) 150 MG 24 hr tablet Take 1 tablet (150 mg total) by mouth daily. (Patient not taking: Reported on 02/19/2019) 30 tablet 1  . naproxen (NAPROSYN) 500 MG tablet Take by mouth.    Marland Kitchen ibuprofen (ADVIL,MOTRIN) 800 MG tablet Take 1 tablet (800 mg total) by mouth every 8 (eight) hours as needed for moderate pain. (Patient not taking: Reported on 02/19/2019) 15 tablet 0  . medroxyPROGESTERone (PROVERA) 10 MG tablet Take 1 tablet (10 mg total) by mouth daily for 10 days. 10 tablet 3  . metFORMIN (GLUCOPHAGE-XR) 500 MG 24 hr tablet Take 1 tablet (500 mg total) by mouth daily with breakfast. (Patient not taking: Reported on 02/19/2019) 30 tablet 0   No facility-administered medications prior to visit.      ROS:  Review of Systems  Constitutional: Negative for fever.  Gastrointestinal: Negative for blood in stool, constipation, diarrhea, nausea and vomiting.  Genitourinary: Positive for menstrual problem and pelvic pain. Negative for dyspareunia, dysuria, flank pain, frequency, hematuria, urgency, vaginal bleeding, vaginal discharge and vaginal pain.  Musculoskeletal: Negative for back pain.  Skin: Negative for rash.  BREAST: No symptoms   OBJECTIVE:   Vitals:  Ht 5\' 2"  (1.575 m)   Wt (!) 361 lb (163.7 kg)   LMP 10/27/2019 (Exact Date)   BMI 66.03 kg/m   Physical Exam Vitals reviewed.    Constitutional:      Appearance: She is well-developed.  Pulmonary:     Effort: Pulmonary effort is normal.  Genitourinary:    General: Normal vulva.     Pubic Area: No rash.      Labia:        Right: No rash, tenderness or lesion.        Left: No rash, tenderness or lesion.      Vagina: Bleeding present. No vaginal discharge, erythema or tenderness.     Cervix: Normal.     Uterus: Normal. Tender. Not  enlarged.      Adnexa:        Right: Tenderness present. No mass.         Left: Tenderness present. No mass.    Musculoskeletal:        General: Normal range of motion.     Cervical back: Normal range of motion.  Skin:    General: Skin is warm and dry.  Neurological:     General: No focal deficit present.     Mental Status: She is alert and oriented to person, place, and time.  Psychiatric:        Mood and Affect: Mood normal.        Behavior: Behavior normal.        Thought Content: Thought content normal.        Judgment: Judgment normal.     Results: ULTRASOUND REPORT  Location: [facility]  Date of Service: 01/14/2020    Indications:Abnormal Uterine Bleeding Findings:  The uterus is anteverted and measures 7.2 x 4.1 x 3.4 cm. Echo texture is homogenous without evidence of focal masses. The Endometrium measures 9.6 mm.  Right Ovary measures 3.0 x 1.6 x 1.3 cm. It is normal in appearance. Left Ovary measures 3.1 x 1.6 x 1.6 cm. It is normal in appearance. Survey of the adnexa demonstrates no adnexal masses. There is no free fluid in the cul de sac.  Impression: 1. The endometrium is heterogeneous. An endometrial polyp can not be ruled out.  2. Normal appearing myometrium and cervix.  3. Normal appearing ovaries.   Recommendations: 1.Clinical correlation with the patient's History and Physical Exam.   Gweneth Dimitri, RT  Assessment/Plan: DUB (dysfunctional uterine bleeding) - Plan: US PELVIS TRANSVAGINAL NON-OB (TV ONLY); Since 11/20. Neg GYN  u/s, EM=9.6 mm. Rx aygestin to stop bleeding; f/u if sx cont fro EMB. Discussed cycle mgmt going forward with POPs, depo (not recommended due to wt), nexplanon, IUD (pt not very tolerant of GYN exams). Pt would like to cont Q90 day provera and knows importance of that. Rx RF provera. F/u prn.   Amenorrhea--do provera Q90 days. F/u prn.   Meds ordered this encounter  Medications  . norethindrone (AYGESTIN) 5 MG tablet    Sig: Take 1 tablet (5 mg total) by mouth daily for 14 days.    Dispense:  14 tablet    Refill:  0    Order Specific Question:   Supervising Provider    Answer:   Gae Dry [id number]  . medroxyPROGESTERone (PROVERA) 10 MG tablet    Sig: Take 1 tablet (10 mg total) by mouth daily for 7 days. Q90 days if no spontaneous menses    Dispense:  28 tablet    Refill:  0    Order Specific Question:   Supervising Provider    Answer:   Gae Dry [[id number]]     Return if symptoms worsen or fail to improve.  Jafar Poffenberger B. Clarity Ciszek, PA-C 01/14/2020 5:21 PM

## 2020-01-25 ENCOUNTER — Other Ambulatory Visit: Payer: Self-pay

## 2020-01-25 DIAGNOSIS — N938 Other specified abnormal uterine and vaginal bleeding: Secondary | ICD-10-CM

## 2020-01-25 MED ORDER — NORETHINDRONE ACETATE 5 MG PO TABS: 5.0000 mg | ORAL_TABLET | Freq: Every day | ORAL | 0 refills | Status: DC

## 2020-01-25 NOTE — Telephone Encounter (Signed)
Rx was accidentally printed and not sent electronically, per [facility] call pt and check if she is still bleeding to resend Rx? Pt says yes, bleeding is still the same. Rx eRxd.

## 2020-02-17 ENCOUNTER — Other Ambulatory Visit: Payer: Self-pay | Admitting: Obstetrics and Gynecology

## 2020-02-17 DIAGNOSIS — N912 Amenorrhea, unspecified: Secondary | ICD-10-CM

## 2020-06-09 ENCOUNTER — Emergency Department
Admission: EM | Admit: 2020-06-09 | Discharge: 2020-06-09 | Disposition: A | Discharge status: Home / Self Care | Payer: Medicaid Other | Attending: Emergency Medicine | Admitting: Emergency Medicine

## 2020-06-09 ENCOUNTER — Other Ambulatory Visit: Payer: Self-pay

## 2020-06-09 ENCOUNTER — Encounter: Payer: Self-pay | Admitting: Intensive Care

## 2020-06-09 DIAGNOSIS — J449 Chronic obstructive pulmonary disease, unspecified: Secondary | ICD-10-CM | POA: Insufficient documentation

## 2020-06-09 DIAGNOSIS — F329 Major depressive disorder, single episode, unspecified: Secondary | ICD-10-CM | POA: Insufficient documentation

## 2020-06-09 DIAGNOSIS — Z79899 Other long term (current) drug therapy: Secondary | ICD-10-CM | POA: Diagnosis not present

## 2020-06-09 DIAGNOSIS — F32A Depression, unspecified: Secondary | ICD-10-CM

## 2020-06-09 DIAGNOSIS — F17210 Nicotine dependence, cigarettes, uncomplicated: Secondary | ICD-10-CM | POA: Insufficient documentation

## 2020-06-09 DIAGNOSIS — I10 Essential (primary) hypertension: Secondary | ICD-10-CM

## 2020-06-09 DIAGNOSIS — E039 Hypothyroidism, unspecified: Secondary | ICD-10-CM | POA: Diagnosis not present

## 2020-06-09 LAB — URINALYSIS, COMPLETE (UACMP) WITH MICROSCOPIC
Bacteria, UA: NONE SEEN
Bilirubin Urine: NEGATIVE
Glucose, UA: NEGATIVE mg/dL
Hgb urine dipstick: NEGATIVE
Ketones, ur: NEGATIVE mg/dL
Leukocytes,Ua: NEGATIVE
Nitrite: NEGATIVE
Protein, ur: >=300 mg/dL — AB
Specific Gravity, Urine: 1.015 (ref 1.005–1.030)
pH: 6 (ref 5.0–8.0)

## 2020-06-09 LAB — CBC
HCT: 37.9 % (ref 36.0–46.0)
Hemoglobin: 11.9 g/dL — ABNORMAL LOW (ref 12.0–15.0)
MCH: 24.2 pg — ABNORMAL LOW (ref 26.0–34.0)
MCHC: 31.4 g/dL (ref 30.0–36.0)
MCV: 77.2 fL — ABNORMAL LOW (ref 80.0–100.0)
Platelets: 293 10*3/uL (ref 150–400)
RBC: 4.91 MIL/uL (ref 3.87–5.11)
RDW: 15.5 % (ref 11.5–15.5)
WBC: 12.6 10*3/uL — ABNORMAL HIGH (ref 4.0–10.5)
nRBC: 0 % (ref 0.0–0.2)

## 2020-06-09 LAB — URINE DRUG SCREEN, QUALITATIVE (ARMC ONLY)
Amphetamines, Ur Screen: NOT DETECTED
Barbiturates, Ur Screen: NOT DETECTED
Benzodiazepine, Ur Scrn: NOT DETECTED
Cannabinoid 50 Ng, Ur ~~LOC~~: NOT DETECTED
Cocaine Metabolite,Ur ~~LOC~~: NOT DETECTED
MDMA (Ecstasy)Ur Screen: NOT DETECTED
Methadone Scn, Ur: NOT DETECTED
Opiate, Ur Screen: NOT DETECTED
Phencyclidine (PCP) Ur S: NOT DETECTED
Tricyclic, Ur Screen: NOT DETECTED

## 2020-06-09 LAB — COMPREHENSIVE METABOLIC PANEL
ALT: 20 U/L (ref 0–44)
AST: 17 U/L (ref 15–41)
Albumin: 3.3 g/dL — ABNORMAL LOW (ref 3.5–5.0)
Alkaline Phosphatase: 96 U/L (ref 38–126)
Anion gap: 9 (ref 5–15)
BUN: 11 mg/dL (ref 6–20)
CO2: 27 mmol/L (ref 22–32)
Calcium: 8.9 mg/dL (ref 8.9–10.3)
Chloride: 101 mmol/L (ref 98–111)
Creatinine, Ser: 0.98 mg/dL (ref 0.44–1.00)
GFR calc Af Amer: >60 mL/min (ref >60)
GFR calc non Af Amer: >60 mL/min (ref >60)
Glucose, Bld: 89 mg/dL (ref 70–99)
Potassium: 3.8 mmol/L (ref 3.5–5.1)
Sodium: 137 mmol/L (ref 135–145)
Total Bilirubin: 0.6 mg/dL (ref 0.3–1.2)
Total Protein: 8.3 g/dL — ABNORMAL HIGH (ref 6.5–8.1)

## 2020-06-09 LAB — ACETAMINOPHEN LEVEL: Acetaminophen (Tylenol), Serum: <10 ug/mL — ABNORMAL LOW (ref 10–30)

## 2020-06-09 LAB — ETHANOL: Alcohol, Ethyl (B): <10 mg/dL (ref <10)

## 2020-06-09 LAB — PREGNANCY, URINE: Preg Test, Ur: NEGATIVE

## 2020-06-09 LAB — TROPONIN I (HIGH SENSITIVITY)
Troponin I (High Sensitivity): 4 ng/L (ref <18)
Troponin I (High Sensitivity): 7 ng/L (ref <18)

## 2020-06-09 LAB — SALICYLATE LEVEL: Salicylate Lvl: <7 mg/dL — ABNORMAL LOW (ref 7.0–30.0)

## 2020-06-09 MED ORDER — NORETHINDRONE ACETATE 5 MG PO TABS
5.0000 mg | ORAL_TABLET | Freq: Every day | ORAL | Status: DC
Start: 2020-06-09 — End: 2020-06-10

## 2020-06-09 MED ORDER — LEVOTHYROXINE SODIUM 50 MCG PO TABS: 25.0000 ug | ORAL_TABLET | Freq: Every day | ORAL | Status: DC

## 2020-06-09 MED ORDER — PANTOPRAZOLE SODIUM 40 MG PO TBEC: 40.0000 mg | DELAYED_RELEASE_TABLET | Freq: Every day | ORAL | Status: DC

## 2020-06-09 MED ORDER — MOMETASONE FURO-FORMOTEROL FUM 200-5 MCG/ACT IN AERO
2.0000 | INHALATION_SPRAY | Freq: Two times a day (BID) | RESPIRATORY_TRACT | Status: DC
  Filled 2020-06-09: qty 8.8

## 2020-06-09 MED ORDER — METOPROLOL SUCCINATE ER 50 MG PO TB24
100.0000 mg | ORAL_TABLET | Freq: Every day | ORAL | Status: DC
  Administered 2020-06-09: 100 mg via ORAL
  Filled 2020-06-09: qty 2

## 2020-06-09 MED ORDER — MEDROXYPROGESTERONE ACETATE 10 MG PO TABS: 10.0000 mg | ORAL_TABLET | Freq: Every day | ORAL | Status: DC

## 2020-06-09 MED ORDER — CLONAZEPAM 0.5 MG PO TABS: 0.5000 mg | ORAL_TABLET | Freq: Two times a day (BID) | ORAL | Status: DC

## 2020-06-09 MED ORDER — SPACER/AERO CHAMBER MOUTHPIECE MISC: 1.0000 [IU] | Status: DC | PRN

## 2020-06-09 MED ORDER — ALBUTEROL SULFATE HFA 108 (90 BASE) MCG/ACT IN AERS
1.0000 | INHALATION_SPRAY | Freq: Four times a day (QID) | RESPIRATORY_TRACT | Status: DC
  Administered 2020-06-09: 2 via RESPIRATORY_TRACT
  Filled 2020-06-09: qty 6.7

## 2020-06-09 MED ORDER — BUPROPION HCL ER (XL) 150 MG PO TB24
150.0000 mg | ORAL_TABLET | Freq: Every day | ORAL | Status: DC
  Administered 2020-06-09: 150 mg via ORAL
  Filled 2020-06-09: qty 1

## 2020-06-09 MED ORDER — LABETALOL HCL 5 MG/ML IV SOLN: 10.0000 mg | Freq: Once | INTRAVENOUS | Status: DC

## 2020-06-09 MED ORDER — AMLODIPINE BESYLATE 5 MG PO TABS
10.0000 mg | ORAL_TABLET | Freq: Every day | ORAL | Status: DC
  Administered 2020-06-09: 10 mg via ORAL
  Filled 2020-06-09: qty 2

## 2020-06-09 MED ORDER — HYDROCHLOROTHIAZIDE 25 MG PO TABS
25.0000 mg | ORAL_TABLET | Freq: Every day | ORAL | Status: DC
  Administered 2020-06-09: 25 mg via ORAL
  Filled 2020-06-09: qty 1

## 2020-06-09 MED ORDER — AMLODIPINE BESYLATE 5 MG PO TABS: 5.0000 mg | ORAL_TABLET | Freq: Every day | ORAL | Status: DC

## 2020-06-09 NOTE — ED Notes (Signed)
Lab called for urine preg add on 

## 2020-06-09 NOTE — BH Assessment (Signed)
5:21 PM this Clinical research associate provided patient with community and outpatient resources.

## 2020-06-09 NOTE — ED Notes (Signed)
Call received from TTS patient will be discharged to home with out patient resources. Awaiting discharge papers.

## 2020-06-09 NOTE — ED Notes (Addendum)
Patient dressed in our scrubs ,patient has one pair of black shoes and one black and pink dress and cell phone in black case,one silver-like nose ring.Patient unable to wear scrub top so patient was given gown and scrub bottoms
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
## 2020-06-09 NOTE — ED Notes (Signed)
Supper tray was given with juice. 

## 2020-06-09 NOTE — ED Provider Notes (Addendum)
[facility] Emergency Department Provider Note   ____________________________________________   First MD Initiated Contact with Patient 06/09/20 1401     (approximate)  I have reviewed the triage vital signs and the nursing notes.   HISTORY  Chief Complaint Psychiatric Evaluation    HPI Katrina Meyers is a 29 y.o. female who lives with her wife.  She reports her wife is bipolar and has been depressed a lot.  She says is very hard to be the positive person all the time and today she just had it and she told the nurse in fact that she was going to like oil and stepped on the gas to kill her self.  She is not taking her Wellbutrin and antihypertensives here lately.  She confirmed with me that she is not taking her medicines and felt suicidal today.  Initial blood pressure was 240/130.  I will start her on a little bit of labetalol and then restart her medicines    Patient reports she has been having some chest pain in the left chest.  It is a stabbing pain lasts a few seconds and goes away and comes back again later.  Does not seem to be associated with exertion or anything else except for anxiety.  I will get an EKG and troponin just in case.  She has at least 2 risk factors being obese and hypertensive.      Past Medical History:  Diagnosis Date  . Anxiety 2019  . Borderline diabetic   . Chronic bronchitis (HCC)   . COPD (chronic obstructive pulmonary disease) (HCC)   . Depression 2019  . Hypertension   . IBS (irritable bowel syndrome)   . Morbid obesity (HCC)   . Reflux   . Thyroid disease     Patient Active Problem List   Diagnosis Date Noted  . Elevated blood pressure reading in office with diagnosis of hypertension 06/17/2018  . Hidradenitis suppurativa 06/04/2018  . Anxiety and depression 06/04/2018  . Amenorrhea, secondary 06/04/2018  . Class 3 severe obesity due to excess calories with serious comorbidity and body mass index (BMI) of 50.0  to 59.9 in adult (HCC) 09/10/2017  . Gastroesophageal reflux disease without esophagitis 09/10/2017  . Irritable bowel syndrome 09/10/2017  . Prediabetes 09/10/2017  . HTN (hypertension) 05/28/2017  . LGSIL on Pap smear of cervix 10/15/2016  . Hypothyroidism, adult 06/28/2016  . Extremity pain 03/26/2014    Past Surgical History:  Procedure Laterality Date  . UPPER ENDOSCOPY W/ SCLEROTHERAPY      Prior to Admission medications   Medication Sig Start Date End Date Taking? Authorizing Provider  albuterol (PROVENTIL HFA;VENTOLIN HFA) 108 (90 Base) MCG/ACT inhaler Inhale 2-4 puffs by mouth every 4 hours as needed for wheezing, cough, and/or shortness of breath 10/09/17   Loleta Rose, MD  amLODipine (NORVASC) 5 MG tablet Take 1 tablet (5 mg total) by mouth daily. 09/18/19 09/17/20  Minna Antis, MD  buPROPion (WELLBUTRIN XL) 150 MG 24 hr tablet Take 1 tablet (150 mg total) by mouth daily. Patient not taking: Reported on 02/19/2019 06/04/18 06/04/19  Copland, Ilona Sorrel, PA-C  Cholecalciferol 1.25 MG (50000 UT) capsule Take by mouth. 01/12/20 01/11/21  [provider]  hydrochlorothiazide (HYDRODIURIL) 25 MG tablet Take 1 tablet (25 mg total) by mouth daily. 09/18/19   Minna Antis, MD  hydrOXYzine (ATARAX/VISTARIL) 25 MG tablet Take by mouth. 11/30/19   [provider]  levothyroxine (SYNTHROID, LEVOTHROID) 25 MCG tablet Take 1 tablet (25  mcg total) by mouth daily before breakfast. 09/30/18   Rockne Menghini, MD  medroxyPROGESTERone (PROVERA) 10 MG tablet Take 1 tablet (10 mg total) by mouth daily for 7 days. Q90 days if no spontaneous menses 01/14/20 01/21/20  Copland, Alicia B, PA-C  metoprolol succinate (TOPROL-XL) 50 MG 24 hr tablet Take by mouth. 01/12/20 01/11/21  [provider]  mometasone-formoterol (DULERA) 200-5 MCG/ACT AERO Inhale into the lungs. 11/30/19 11/29/20  [provider]  naproxen (NAPROSYN) 500 MG tablet Take by mouth.    [provider]  norethindrone (AYGESTIN) 5 MG tablet Take 1 tablet (5 mg total) by mouth daily for 14 days. 01/25/20 02/08/20  Copland, Ilona Sorrel, PA-C  omeprazole (PRILOSEC) 20 MG capsule Take 20 mg by mouth daily.    [provider]  Spacer/Aero Chamber Mouthpiece MISC 1 Units by Does not apply route every 4 (four) hours as needed (wheezing). 09/09/17   Merrily Brittle, MD  Vitamin D, Ergocalciferol, (DRISDOL) 50000 units CAPS capsule Take 50,000 Units by mouth once a week. 06/05/18   [provider]    Allergies Reglan [metoclopramide]  Family History  Problem Relation Age of Onset  . Diabetes Mellitus II Mother   . Diabetes Mellitus II Father   . Breast cancer Maternal Aunt 43    Social History Social History   Tobacco Use  . Smoking status: Current Every Day Smoker    Packs/day: 0.30    Types: Cigarettes  . Smokeless tobacco: Never Used  . Tobacco comment: has called 1-800-quit now and is decreasing smoking  Vaping Use  . Vaping Use: Never used  Substance Use Topics  . Alcohol use: No  . Drug use: No    Review of Systems  Constitutional: No fever/chills Eyes: No visual changes. ENT: No sore throat. Cardiovascular: Denies chest pain. Respiratory: Denies shortness of breath. Gastrointestinal: No abdominal pain.  No nausea, no vomiting.  No diarrhea.  No constipation. Genitourinary: Negative for dysuria. Musculoskeletal: Negative for back pain. Skin: Negative for rash. Neurological: Negative for headaches, focal weakness     ____________________________________________   PHYSICAL EXAM:  VITAL SIGNS: ED Triage Vitals  Enc Vitals Group     BP 06/09/20 1311 (!) 240/123     Pulse Rate 06/09/20 1311 88     Resp 06/09/20 1311 18     Temp 06/09/20 1311 98.4 F (36.9 C)     Temp Source 06/09/20 1311 Oral     SpO2 06/09/20 1311 99 %     Weight 06/09/20 1315 (!) 342 lb (155.1 kg)     Height 06/09/20 1315 5\' 2"  (1.575 m)     Head Circumference --        Peak Flow --      Pain Score 06/09/20 1315 5     Pain Loc --      Pain Edu? --      Excl. in GC? --     Constitutional: Alert and oriented. Well appearing and in no acute distress. Eyes: Conjunctivae are normal. PERRL. EOMI. fundi appear normal I do not see any signs of retinal hemorrhage or AV nicking Head: Atraumatic. Nose: No congestion/rhinnorhea. Mouth/Throat: Mucous membranes are moist.  Oropharynx non-erythematous. Neck: No stridor.  Cardiovascular: Normal rate, regular rhythm. Grossly normal heart sounds.  Good peripheral circulation. Respiratory: Normal respiratory effort.  No retractions. Lungs CTAB. Gastrointestinal: Soft and nontender. No distention. No abdominal bruits. No CVA tenderness. Musculoskeletal: No lower extremity tenderness trace bilateral edema.   Neurologic:  Normal  speech and language. No gross focal neurologic deficits are appreciated.. Skin:  Skin is warm, dry and intact. No rash noted.   ____________________________________________   LABS (all labs ordered are listed, but only abnormal results are displayed)  Labs Reviewed  COMPREHENSIVE METABOLIC PANEL - Abnormal; Notable for the following components:      Result Value   Total Protein 8.3 (*)    Albumin 3.3 (*)    All other components within normal limits  SALICYLATE LEVEL - Abnormal; Notable for the following components:   Salicylate Lvl <7.0 (*)    All other components within normal limits  ACETAMINOPHEN LEVEL - Abnormal; Notable for the following components:   Acetaminophen (Tylenol), Serum <10 (*)    All other components within normal limits  CBC - Abnormal; Notable for the following components:   WBC 12.6 (*)    Hemoglobin 11.9 (*)    MCV 77.2 (*)    MCH 24.2 (*)    All other components within normal limits  URINALYSIS, COMPLETE (UACMP) WITH MICROSCOPIC - Abnormal; Notable for the following components:   Color, Urine YELLOW (*)    APPearance HAZY (*)    Protein, ur >=300 (*)     All other components within normal limits  ETHANOL  URINE DRUG SCREEN, QUALITATIVE ([facility] ONLY)  PREGNANCY, URINE  POC URINE PREG, ED  TROPONIN I (HIGH SENSITIVITY)  TROPONIN I (HIGH SENSITIVITY)   ____________________________________________  EKG   ____________________________________________  RADIOLOGY  ED MD interpretation:   Official radiology report(s): No results found.  ____________________________________________   PROCEDURES  Procedure(s) performed (including Critical Care):  Procedures   ____________________________________________   INITIAL IMPRESSION / ASSESSMENT AND PLAN / ED COURSE  Patient seen by psychiatry.  She contracts for safety.  Psychiatry feels she is safe to go home.  Her blood pressure has come down nicely on her usual medications.  In the next few days it might go down further.   We will let her go.  She will have instructions for follow-up.  She should follow-up with her primary care doctor as well for her medical problems like hypertension.  She does have some edema in her legs with a small amount.             ____________________________________________   FINAL CLINICAL IMPRESSION(S) / ED DIAGNOSES  Final diagnoses:  Severe hypertension  Depression, unspecified depression type     ED Discharge Orders    None       Note:  This document was prepared using Dragon voice recognition software and may include unintentional dictation errors.    Arnaldo Natal, MD 06/09/20 1441    Arnaldo Natal, MD 06/09/20 1443    Arnaldo Natal, MD 06/09/20 1924

## 2020-06-09 NOTE — Discharge Instructions (Addendum)
Please be sure to take all your medicines as prescribed.  Your blood pressure was very very high today.  If it goes up that high and stays that he could easily have a stroke given your young age.  Please return for any further problems.  Please follow-up with your primary care doctor and if you feel depressed or upset again you can follow-up with [facility] as well.

## 2020-06-09 NOTE — ED Notes (Signed)
Dinner tray provided

## 2020-06-09 NOTE — Consult Note (Signed)
[facility] Face-to-Face Psychiatry Consult   Reason for Consult:  Voluntary  Referring Physician:  ED MD  Patient Identification: Katrina Meyers MRN:  [id number] Principal Diagnosis: <principal problem not specified> Diagnosis:  Active Problems:   * No active hospital problems. *   Total Time spent with patient: 30-40   Subjective:   Katrina Meyers is a 29 y.o. female patient admitted with  Crisis in her marriage --Came to ER and was given support and discharged without problem   HPI:   AA female came in overwhelm because her wife has mania and is not getting treatment.  Patient feels stressed and upset and needed to speak in crisis mode with TTS.   One dose of ativan given and patient calmed down and formed and outpatient plan to go home and deal with the stresses at hand.  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, corporate for safety   Oriented times four Rapport and eye contact okay Mood anxious and depressed same for affect Concentration and attention okay Consciousness not fluctuant or clouded No shakes tremors and all  Memory remote recent and immediate okay Speech slightly anxious and pressured Judgement insight reliability normal Intelligence and fund of knowledge okay  Abstraction normal  Though process and content --anxious and stress and family marital themes, no frank psychosis or mania   Leans not known  Musculoskeletal okay Gait and station hard due to obesity  Recall okay Akathisia none Handedness not known Psychomotor normal Cognition normal  Assets ---seeks active help  Sleep normal    Court or legal  none  Education HS  Drugs and ETOH none   BC developmental delays none     Past Psychiatric History:  Outpatient only   Risk to Self: Suicidal Ideation: No Suicidal Intent: No Is patient at risk for suicide?: No Suicidal Plan?: No Access to Means: No What has been your use of drugs/alcohol within the last 12 months?: N/a How many times?: 0 Other Self Harm Risks: Depression hx Triggers for Past Attempts: None known Intentional Self Injurious Behavior: None Risk to Others: Homicidal Ideation: No Thoughts of Harm to Others: No Current Homicidal Intent: No Current Homicidal Plan: No Access to Homicidal Means: No Identified Victim: n/a History of harm to others?: No Assessment of Violence: None Noted Violent Behavior Description: n/a Does patient have access to weapons?: No Criminal Charges Pending?: No Does patient have a court date: No Prior Inpatient Therapy: Prior Inpatient Therapy: No Prior Outpatient Therapy: Prior Outpatient Therapy: No Does patient have an ACCT team?: No Does patient have Intensive In-House Services?  : No Does patient have [facility] services? : No Does patient have P4CC services?: No  Past Medical History:  Past Medical History:  Diagnosis Date  . Anxiety 2019  . Borderline diabetic   . Chronic bronchitis (HCC)   . COPD (chronic obstructive pulmonary disease) (HCC)   . Depression 2019  . Hypertension   . IBS (irritable bowel syndrome)   . Morbid obesity (HCC)   . Reflux   . Thyroid disease  Past Surgical History:  Procedure Laterality Date  . UPPER ENDOSCOPY W/ SCLEROTHERAPY     Family History:  Family History  Problem Relation Age of Onset  . Diabetes Mellitus II Mother   . Diabetes Mellitus II Father   . Breast cancer Maternal Aunt 53   Family Psychiatric  History:  Mom with depression  Social History:  Lives with wife with alleged mania and step daughter  With family arguments and problems  Social History   Substance and  Sexual Activity  Alcohol Use No     Social History   Substance and Sexual Activity  Drug Use No    Social History   Socioeconomic History  . Marital status: Single    Spouse name: Not on file  . Number of children: Not on file  . Years of education: Not on file  . Highest education level: Not on file  Occupational History  . Not on file  Tobacco Use  . Smoking status: Current Every Day Smoker    Packs/day: 0.30    Types: Cigarettes  . Smokeless tobacco: Never Used  . Tobacco comment: has called 1-800-quit now and is decreasing smoking  Vaping Use  . Vaping Use: Never used  Substance and Sexual Activity  . Alcohol use: No  . Drug use: No  . Sexual activity: Yes    Birth control/protection: None    Comment: female partner  Other Topics Concern  . Not on file  Social History Narrative  . Not on file   Social Determinants of Health   Financial Resource Strain:   . Difficulty of Paying Living Expenses: Not on file  Food Insecurity:   . Worried About Programme researcher, broadcasting/film/video in the Last Year: Not on file  . Ran Out of Food in the Last Year: Not on file  Transportation Needs:   . Lack of Transportation (Medical): Not on file  . Lack of Transportation (Non-Medical): Not on file  Physical Activity:   . Days of Exercise per Week: Not on file  . Minutes of Exercise per Session: Not on file  Stress:   . Feeling of Stress : Not on file  Social Connections:   . Frequency of Communication with Friends and Family: Not on file  . Frequency of Social Gatherings with Friends and Family: Not on file  . Attends Religious Services: Not on file  . Active Member of Clubs or Organizations: Not on file  . Attends Banker Meetings: Not on file  . Marital Status: Not on file   Additional Social History:    Allergies:   Allergies  Allergen Reactions  . Reglan [Metoclopramide] Other (See Comments)    Labs:  Results for orders placed or performed during the hospital  encounter of 06/09/20 (from the past 48 hour(s))  Comprehensive metabolic panel     Status: Abnormal   Collection Time: 06/09/20  1:19 PM  Result Value Ref Range   Sodium 137 135 - 145 mmol/L   Potassium 3.8 3.5 - 5.1 mmol/L   Chloride 101 98 - 111 mmol/L   CO2 27 22 - 32 mmol/L   Glucose, Bld 89 70 - 99 mg/dL    Comment: Glucose reference range applies only to samples taken after fasting for at least 8 hours.   BUN 11 6 - 20 mg/dL   Creatinine, Ser 7.16 0.44 - 1.00 mg/dL   Calcium 8.9 8.9 - 96.7 mg/dL   Total Protein 8.3 (H) 6.5 - 8.1 g/dL  Albumin 3.3 (L) 3.5 - 5.0 g/dL   AST 17 15 - 41 U/L   ALT 20 0 - 44 U/L   Alkaline Phosphatase 96 38 - 126 U/L   Total Bilirubin 0.6 0.3 - 1.2 mg/dL   GFR calc non Af Amer >60 >60 mL/min   GFR calc Af Amer >60 >60 mL/min   Anion gap 9 5 - 15    Comment: Performed at [facility], [address] [place], [place] [address]  Ethanol     Status: None   Collection Time: 06/09/20  1:19 PM  Result Value Ref Range   Alcohol, Ethyl (B) <10 <10 mg/dL    Comment: (NOTE) Lowest detectable limit for serum alcohol is 10 mg/dL.  For medical purposes only. Performed at [facility], [address] [place], [place] [address]   Salicylate level     Status: Abnormal   Collection Time: 06/09/20  1:19 PM  Result Value Ref Range   Salicylate Lvl <7.0 (L) 7.0 - 30.0 mg/dL    Comment: Performed at [facility], [address] [place], [place] [address]  Acetaminophen level     Status: Abnormal   Collection Time: 06/09/20  1:19 PM  Result Value Ref Range   Acetaminophen (Tylenol), Serum <10 (L) 10 - 30 ug/mL    Comment: (NOTE) Therapeutic concentrations vary significantly. A range of 10-30 ug/mL  may be an effective concentration for many patients. However, some  are best treated at concentrations outside of this range. Acetaminophen concentrations >150 ug/mL at 4 hours after ingestion  and >50 ug/mL at 12 hours after  ingestion are often associated with  toxic reactions.  Performed at [facility], [address] [place], [place] [address]   cbc     Status: Abnormal   Collection Time: 06/09/20  1:19 PM  Result Value Ref Range   WBC 12.6 (H) 4.0 - 10.5 K/uL   RBC 4.91 3.87 - 5.11 MIL/uL   Hemoglobin 11.9 (L) 12.0 - 15.0 g/dL   HCT 29.5 36 - 46 %   MCV 77.2 (L) 80.0 - 100.0 fL   MCH 24.2 (L) 26.0 - 34.0 pg   MCHC 31.4 30.0 - 36.0 g/dL   RDW 62.1 30.8 - 65.7 %   Platelets 293 150 - 400 K/uL   nRBC 0.0 0.0 - 0.2 %    Comment: Performed at [facility], [address], [place], [place] [address]  Urine Drug Screen, Qualitative     Status: None   Collection Time: 06/09/20  1:19 PM  Result Value Ref Range   Tricyclic, Ur Screen NONE DETECTED NONE DETECTED   Amphetamines, Ur Screen NONE DETECTED NONE DETECTED   MDMA (Ecstasy)Ur Screen NONE DETECTED NONE DETECTED   Cocaine Metabolite,Ur [place] NONE DETECTED NONE DETECTED   Opiate, Ur Screen NONE DETECTED NONE DETECTED   Phencyclidine (PCP) Ur S NONE DETECTED NONE DETECTED   Cannabinoid 50 Ng, Ur [place] NONE DETECTED NONE DETECTED   Barbiturates, Ur Screen NONE DETECTED NONE DETECTED   Benzodiazepine, Ur Scrn NONE DETECTED NONE DETECTED   Methadone Scn, Ur NONE DETECTED NONE DETECTED    Comment: (NOTE) Tricyclics + metabolites, urine    Cutoff 1000 ng/mL Amphetamines + metabolites, urine  Cutoff 1000 ng/mL MDMA (Ecstasy), urine              Cutoff 500 ng/mL Cocaine Metabolite, urine          Cutoff 300 ng/mL Opiate + metabolites, urine  Cutoff 300 ng/mL Phencyclidine (PCP), urine         Cutoff 25 ng/mL Cannabinoid, urine                 Cutoff 50 ng/mL Barbiturates + metabolites, urine  Cutoff 200 ng/mL Benzodiazepine, urine              Cutoff 200 ng/mL Methadone, urine                   Cutoff 300 ng/mL  The urine drug screen provides only a preliminary, unconfirmed analytical test result and should not be used for  non-medical purposes. Clinical consideration and professional judgment should be applied to any positive drug screen result due to possible interfering substances. A more specific alternate chemical method must be used in order to obtain a confirmed analytical result. Gas chromatography / mass spectrometry (GC/MS) is the preferred confirm atory method. Performed at [facility], [address] [place], [place] [address]   Urinalysis, Complete w Microscopic     Status: Abnormal   Collection Time: 06/09/20  1:19 PM  Result Value Ref Range   Color, Urine YELLOW (A) YELLOW   APPearance HAZY (A) CLEAR   Specific Gravity, Urine 1.015 1.005 - 1.030   pH 6.0 5.0 - 8.0   Glucose, UA NEGATIVE NEGATIVE mg/dL   Hgb urine dipstick NEGATIVE NEGATIVE   Bilirubin Urine NEGATIVE NEGATIVE   Ketones, ur NEGATIVE NEGATIVE mg/dL   Protein, ur >=604>=300 (A) NEGATIVE mg/dL   Nitrite NEGATIVE NEGATIVE   Leukocytes,Ua NEGATIVE NEGATIVE   RBC / HPF 0-5 0 - 5 RBC/hpf   WBC, UA 6-10 0 - 5 WBC/hpf   Bacteria, UA NONE SEEN NONE SEEN   Squamous Epithelial / LPF 0-5 0 - 5    Comment: Performed at [facility], [address] [place], [place] [address]  Pregnancy, urine     Status: None   Collection Time: 06/09/20  1:19 PM  Result Value Ref Range   Preg Test, Ur NEGATIVE NEGATIVE    Comment: Performed at [facility], [address] [place], [place] [address]  Troponin I (High Sensitivity)     Status: None   Collection Time: 06/09/20  2:26 PM  Result Value Ref Range   Troponin I (High Sensitivity) 4 <18 ng/L    Comment: (NOTE) Elevated high sensitivity troponin I (hsTnI) values and significant  changes across serial measurements may suggest ACS but many other  chronic and acute conditions are known to elevate hsTnI results.  Refer to the "Links" section for chest pain algorithms and additional  guidance. Performed at [facility], [address]  [place], [place] [address]   Troponin I (High Sensitivity)     Status: None   Collection Time: 06/09/20  4:31 PM  Result Value Ref Range   Troponin I (High Sensitivity) 7 <18 ng/L    Comment: (NOTE) Elevated high sensitivity troponin I (hsTnI) values and significant  changes across serial measurements may suggest ACS but many other  chronic and acute conditions are known to elevate hsTnI results.  Refer to the "Links" section for chest pain algorithms and additional  guidance. Performed at [facility], [address] [place], [place] [address]     Current Facility-Administered Medications  Medication Dose Route Frequency Provider Last Rate Last Admin  . albuterol (VENTOLIN HFA) 108 (90 Base) MCG/ACT inhaler 1-2 puff  1-2 puff Inhalation QID Arnaldo NatalMalinda, Paul F, MD   2 puff  at 06/09/20 1614  . amLODipine (NORVASC) tablet 10 mg  10 mg Oral Daily Arnaldo Natal, MD   10 mg at 06/09/20 1502  . buPROPion (WELLBUTRIN XL) 24 hr tablet 150 mg  150 mg Oral Daily Arnaldo Natal, MD   150 mg at 06/09/20 1502  . clonazePAM (KLONOPIN) tablet 0.5 mg  0.5 mg Oral BID Roselind Messier, MD      . hydrochlorothiazide (HYDRODIURIL) tablet 25 mg  25 mg Oral Daily Arnaldo Natal, MD   25 mg at 06/09/20 1502  . [START ON 06/10/2020] levothyroxine (SYNTHROID) tablet 25 mcg  25 mcg Oral QAC breakfast Arnaldo Natal, MD      . medroxyPROGESTERone (PROVERA) tablet 10 mg  10 mg Oral Daily Roselind Messier, MD      . metoprolol succinate (TOPROL-XL) 24 hr tablet 100 mg  100 mg Oral Daily Arnaldo Natal, MD   100 mg at 06/09/20 1502  . mometasone-formoterol (DULERA) 200-5 MCG/ACT inhaler 2 puff  2 puff Inhalation BID Arnaldo Natal, MD      . norethindrone (AYGESTIN) tablet 5 mg  5 mg Oral Daily Roselind Messier, MD      . pantoprazole (PROTONIX) EC tablet 40 mg  40 mg Oral Daily Roselind Messier, MD       Current Outpatient Medications  Medication Sig Dispense Refill  . albuterol (PROVENTIL HFA;VENTOLIN  HFA) 108 (90 Base) MCG/ACT inhaler Inhale 2-4 puffs by mouth every 4 hours as needed for wheezing, cough, and/or shortness of breath 1 Inhaler 1  . amLODipine (NORVASC) 5 MG tablet Take 1 tablet (5 mg total) by mouth daily. 30 tablet 2  . buPROPion (WELLBUTRIN XL) 150 MG 24 hr tablet Take 1 tablet (150 mg total) by mouth daily. (Patient not taking: Reported on 02/19/2019) 30 tablet 1  . Cholecalciferol 1.25 MG (50000 UT) capsule Take by mouth.    . hydrochlorothiazide (HYDRODIURIL) 25 MG tablet Take 1 tablet (25 mg total) by mouth daily. 30 tablet 2  . hydrOXYzine (ATARAX/VISTARIL) 25 MG tablet Take by mouth.    . levothyroxine (SYNTHROID, LEVOTHROID) 25 MCG tablet Take 1 tablet (25 mcg total) by mouth daily before breakfast. 30 tablet 0  . medroxyPROGESTERone (PROVERA) 10 MG tablet Take 1 tablet (10 mg total) by mouth daily for 7 days. Q90 days if no spontaneous menses 28 tablet 0  . metoprolol succinate (TOPROL-XL) 50 MG 24 hr tablet Take by mouth.    . mometasone-formoterol (DULERA) 200-5 MCG/ACT AERO Inhale into the lungs.    . naproxen (NAPROSYN) 500 MG tablet Take by mouth.    . norethindrone (AYGESTIN) 5 MG tablet Take 1 tablet (5 mg total) by mouth daily for 14 days. 14 tablet 0  . omeprazole (PRILOSEC) 20 MG capsule Take 20 mg by mouth daily.    Marland Kitchen Spacer/Aero Chamber Mouthpiece MISC 1 Units by Does not apply route every 4 (four) hours as needed (wheezing). 1 each 0  . Vitamin D, Ergocalciferol, (DRISDOL) 50000 units CAPS capsule Take 50,000 Units by mouth once a week.  1      Psychiatric Specialty Exam: Physical Exam  Review of Systems  Blood pressure (!) 158/96, pulse 82, temperature 98.4 F (36.9 C), temperature source Oral, resp. rate 18, height 5\' 2"  (1.575 m), weight (!) 155.1 kg, SpO2 100 %.Body mass index is 62.55 kg/m.  Treatment Plan Summary:  Patient given support handouts and referrals and prn ativan    Sent home in stable condition   Disposition:  Home with paper support   Roselind Messier, MD 06/09/2020 6:30 PM

## 2020-06-09 NOTE — ED Triage Notes (Signed)
Patient here voluntary and presents with suicidal thought of while driving today. Patient states "I wanted to let go of the wheel and push the gas." Patient reports she is prescribed wellbutrin and HTN meds but is not compliant with medication. Reports she has lost her job recently and dealing with her bi-polar wife has led her to have these feelings. Patient cooperative in triage.

## 2020-06-09 NOTE — ED Notes (Signed)
VOL, pend psych consult 

## 2020-06-09 NOTE — ED Notes (Signed)
Gave pt a soda with ice.

## 2020-06-09 NOTE — BH Assessment (Signed)
Assessment Note  Katrina Meyers is an 29 y.o. female. Per triage note: Patient here voluntary and presents with suicidal thought of while driving today. Patient states "I wanted to let go of the wheel and push the gas." Patient reports she is prescribed Wellbutrin and HTN meds but is not compliant with medication. Reports she has lost her job recently and dealing with her bi-polar wife has led her to have these feelings. Patient cooperative in triage.   Patient was sitting upright on the bed upon this writer's arrival. The patient was tearful and somewhat disheveled. The patient was oriented x4 with logical/relevant thought processes. When asked why she'd been brought to the ED, the patient stated, "My wife is stressing me out. She broke me." Patient expressed that she felt hurting herself due to her inability to cope with her wife's bipolar symptoms. The patient presented with an anxious and depressed mood and affect. The patient had logical/coherent speech. The patient was forthcoming about the dynamics of her relationship explaining that her depression and anxiety symptoms are exacerbated by being around her wife. Patient reported no previous psychiatric admissions. Patient denied any substance abuse. Patient denied any physical, sexual, or emotional abuse both past and present. The patient identified her current stressor as her relationship, her finances, and recently losing her job. The patient stated, "I don't want to deal with everyday life." Patient reported that she has good sleep and a good appetite. Denies any hallucinations, suicidal/homicidal ideations, or paranoia. When asked what she wants from her ER visit the patient stated, "To learn how to cope."    Diagnosis: 296.33 Major depressive disorder, Recurrent episode, Severe  Past Medical History:  Past Medical History:  Diagnosis Date  . Anxiety 2019  . Borderline diabetic   . Chronic bronchitis (HCC)   . COPD (chronic obstructive  pulmonary disease) (HCC)   . Depression 2019  . Hypertension   . IBS (irritable bowel syndrome)   . Morbid obesity (HCC)   . Reflux   . Thyroid disease     Past Surgical History:  Procedure Laterality Date  . UPPER ENDOSCOPY W/ SCLEROTHERAPY      Family History:  Family History  Problem Relation Age of Onset  . Diabetes Mellitus II Mother   . Diabetes Mellitus II Father   . Breast cancer Maternal Aunt 22    Social History:  reports that she has been smoking cigarettes. She has been smoking about 0.30 packs per day. She has never used smokeless tobacco. She reports that she does not drink alcohol and does not use drugs.  Additional Social History:  Alcohol / Drug Use Pain Medications: See PTA Prescriptions: See PTA History of alcohol / drug use?: No history of alcohol / drug abuse  CIWA: CIWA-Ar BP: (!) 158/96 Pulse Rate: 82 COWS:    Allergies:  Allergies  Allergen Reactions  . Reglan [Metoclopramide] Other (See Comments)    Home Medications: (Not in a hospital admission)   OB/GYN Status:  No LMP recorded. (Menstrual status: Irregular Periods).  General Assessment Data Location of Assessment: [facility] ED TTS Assessment: In system Is this a Tele or Face-to-Face Assessment?: Face-to-Face Is this an Initial Assessment or a Re-assessment for this encounter?: Initial Assessment Patient Accompanied by:: N/A Language Other than English: No Living Arrangements: Other (Comment) What gender do you identify as?: Female Date Telepsych consult ordered in [facility]: 06/09/20 Time Telepsych consult ordered in [facility]: 1403 Marital status: Married Jordan name: N/A Pregnancy Status: No Living Arrangements: Spouse/significant  other, Children Can pt return to current living arrangement?: Yes Admission Status: Voluntary Is patient capable of signing voluntary admission?: Yes Referral Source: Self/Family/Friend Insurance type: [place] Medicaid  Medical Screening Exam [facility] Walk-in  ONLY) Medical Exam completed: Yes  Crisis Care Plan Living Arrangements: Spouse/significant other, Children Legal Guardian: Other: (Self) Name of Psychiatrist: None noted Name of Therapist: None Noted  Education Status Is patient currently in school?: No Is the patient employed, unemployed or receiving disability?: Unemployed  Risk to self with the past 6 months Suicidal Ideation: No Has patient been a risk to self within the past 6 months prior to admission? : No Suicidal Intent: No Has patient had any suicidal intent within the past 6 months prior to admission? : No Is patient at risk for suicide?: No Suicidal Plan?: No Has patient had any suicidal plan within the past 6 months prior to admission? : No Access to Means: No What has been your use of drugs/alcohol within the last 12 months?: N/a Previous Attempts/Gestures: No How many times?: 0 Other Self Harm Risks: Depression hx Triggers for Past Attempts: None known Intentional Self Injurious Behavior: None Family Suicide History: Unknown Recent stressful life event(s): Conflict (Comment), Job Loss, Financial Problems Persecutory voices/beliefs?: No Depression: Yes Depression Symptoms: Tearfulness, Feeling worthless/self pity, Feeling angry/irritable Substance abuse history and/or treatment for substance abuse?: No Suicide prevention information given to non-admitted patients: Not applicable  Risk to Others within the past 6 months Homicidal Ideation: No Does patient have any lifetime risk of violence toward others beyond the six months prior to admission? : No Thoughts of Harm to Others: No Current Homicidal Intent: No Current Homicidal Plan: No Access to Homicidal Means: No Identified Victim: n/a History of harm to others?: No Assessment of Violence: None Noted Violent Behavior Description: n/a Does patient have access to weapons?: No Criminal Charges Pending?: No Does patient have a court date: No Is patient on  probation?: No  Psychosis Hallucinations: None noted Delusions: None noted  Mental Status Report Appearance/Hygiene: In scrubs, Disheveled Eye Contact: Good Motor Activity: Freedom of movement Speech: Logical/coherent Level of Consciousness: Alert Mood: Depressed, Helpless Affect: Appropriate to circumstance Anxiety Level: Moderate Thought Processes: Coherent, Relevant Judgement: Unimpaired Orientation: Place, Time, Situation, Person Obsessive Compulsive Thoughts/Behaviors: None  Cognitive Functioning Concentration: Normal Memory: Recent Intact, Remote Intact Is patient IDD: No Insight: Good Impulse Control: Good Appetite: Good Have you had any weight changes? : No Change Sleep: No Change Total Hours of Sleep: 6 Vegetative Symptoms: None  ADLScreening [facility] Assessment Services) Patient's cognitive ability adequate to safely complete daily activities?: Yes Patient able to express need for assistance with ADLs?: Yes Independently performs ADLs?: Yes (appropriate for developmental age)  Prior Inpatient Therapy Prior Inpatient Therapy: No  Prior Outpatient Therapy Prior Outpatient Therapy: No Does patient have an ACCT team?: No Does patient have Intensive In-House Services?  : No Does patient have [facility] services? : No Does patient have P4CC services?: No  ADL Screening (condition at time of admission) Patient's cognitive ability adequate to safely complete daily activities?: Yes Is the patient deaf or have difficulty hearing?: No Does the patient have difficulty seeing, even when wearing glasses/contacts?: No Does the patient have difficulty concentrating, remembering, or making decisions?: No Patient able to express need for assistance with ADLs?: Yes Does the patient have difficulty dressing or bathing?: No Independently performs ADLs?: Yes (appropriate for developmental age) Does the patient have difficulty walking or climbing stairs?: No Weakness of Legs:  None Weakness of  Arms/Hands: None  Home Assistive Devices/Equipment Home Assistive Devices/Equipment: None  Therapy Consults (therapy consults require a physician order) PT Evaluation Needed: No OT Evalulation Needed: No SLP Evaluation Needed: No Abuse/Neglect Assessment (Assessment to be complete while patient is alone) Abuse/Neglect Assessment Can Be Completed: Yes Exploitation of patient/patient's resources: Yes, past (Comment) Values / Beliefs Cultural Requests During Hospitalization: None Spiritual Requests During Hospitalization: None Consults Spiritual Care Consult Needed: No Transition of Care Team Consult Needed: No Advance Directives (For Healthcare) Does Patient Have a Medical Advance Directive?: No Would patient like information on creating a medical advance directive?: No - Patient declined          Disposition: Per Dr. Smith Robert pt can be discharged with outpatient resources to follow up on.  Disposition Initial Assessment Completed for this Encounter: Yes  On Site Evaluation by:   Reviewed with Physician:    Foy Guadalajara 06/09/2020 6:10 PM

## 2020-06-09 NOTE — ED Notes (Signed)
Psych at bedside to consult patient.  

## 2020-06-09 NOTE — ED Notes (Signed)
Lab called will run 1st troponin second trop to be drawn,.

## 2020-09-14 DIAGNOSIS — R079 Chest pain, unspecified: Secondary | ICD-10-CM | POA: Insufficient documentation

## 2020-09-14 DIAGNOSIS — Z5321 Procedure and treatment not carried out due to patient leaving prior to being seen by health care provider: Secondary | ICD-10-CM | POA: Diagnosis not present

## 2020-09-14 DIAGNOSIS — R479 Unspecified speech disturbances: Secondary | ICD-10-CM | POA: Diagnosis present

## 2020-09-14 DIAGNOSIS — R0602 Shortness of breath: Secondary | ICD-10-CM | POA: Insufficient documentation

## 2020-09-15 ENCOUNTER — Encounter: Payer: Self-pay | Admitting: *Deleted

## 2020-09-15 ENCOUNTER — Other Ambulatory Visit: Payer: Self-pay

## 2020-09-15 ENCOUNTER — Telehealth: Payer: Self-pay | Admitting: Emergency Medicine

## 2020-09-15 ENCOUNTER — Emergency Department
Admission: EM | Admit: 2020-09-15 | Discharge: 2020-09-15 | Disposition: A | Discharge status: Home / Self Care | Payer: Medicaid Other | Attending: Emergency Medicine | Admitting: Emergency Medicine

## 2020-09-15 ENCOUNTER — Emergency Department: Payer: Medicaid Other

## 2020-09-15 LAB — TROPONIN I (HIGH SENSITIVITY)
Troponin I (High Sensitivity): 19 ng/L — ABNORMAL HIGH (ref <18)
Troponin I (High Sensitivity): 20 ng/L — ABNORMAL HIGH (ref <18)

## 2020-09-15 LAB — CBC
HCT: 36.6 % (ref 36.0–46.0)
Hemoglobin: 11.1 g/dL — ABNORMAL LOW (ref 12.0–15.0)
MCH: 24 pg — ABNORMAL LOW (ref 26.0–34.0)
MCHC: 30.3 g/dL (ref 30.0–36.0)
MCV: 79 fL — ABNORMAL LOW (ref 80.0–100.0)
Platelets: 311 10*3/uL (ref 150–400)
RBC: 4.63 MIL/uL (ref 3.87–5.11)
RDW: 15.5 % (ref 11.5–15.5)
WBC: 12.5 10*3/uL — ABNORMAL HIGH (ref 4.0–10.5)
nRBC: 0 % (ref 0.0–0.2)

## 2020-09-15 LAB — BASIC METABOLIC PANEL
Anion gap: 7 (ref 5–15)
BUN: 17 mg/dL (ref 6–20)
CO2: 26 mmol/L (ref 22–32)
Calcium: 8.5 mg/dL — ABNORMAL LOW (ref 8.9–10.3)
Chloride: 102 mmol/L (ref 98–111)
Creatinine, Ser: 1.42 mg/dL — ABNORMAL HIGH (ref 0.44–1.00)
GFR, Estimated: 52 mL/min — ABNORMAL LOW (ref >60)
Glucose, Bld: 133 mg/dL — ABNORMAL HIGH (ref 70–99)
Potassium: 3.7 mmol/L (ref 3.5–5.1)
Sodium: 135 mmol/L (ref 135–145)

## 2020-09-15 NOTE — ED Triage Notes (Signed)
Pt to ED via EMS after having a stressful situation at home this past evening that caused her to start having trouble speaking as well has SOB and centralized chest pain. Pt reports the chest pain occurs intermittently but it is worse at this time.  Pt also reports she was shaking at the time her wife called 911 and she is unsure of the cause of this. Pt is calm in triage and able to answer all questions appropriately.

## 2020-09-15 NOTE — Telephone Encounter (Signed)
Called patient due to left emergency department before provider exam to inquire about condition and follow up plans.  She says she has chest pain a lot.  I explained that the troponins were both above normal and that we recommend she return.  She has appointment on 12/6 with pcp. She says she will return here.

## 2020-09-16 ENCOUNTER — Other Ambulatory Visit: Payer: Self-pay

## 2020-09-16 ENCOUNTER — Emergency Department: Payer: Medicaid Other

## 2020-09-16 ENCOUNTER — Emergency Department
Admission: EM | Admit: 2020-09-16 | Discharge: 2020-09-16 | Disposition: A | Discharge status: Home / Self Care | Payer: Medicaid Other | Attending: Emergency Medicine | Admitting: Emergency Medicine

## 2020-09-16 ENCOUNTER — Encounter: Payer: Self-pay | Admitting: Emergency Medicine

## 2020-09-16 DIAGNOSIS — Z7951 Long term (current) use of inhaled steroids: Secondary | ICD-10-CM | POA: Insufficient documentation

## 2020-09-16 DIAGNOSIS — I10 Essential (primary) hypertension: Secondary | ICD-10-CM

## 2020-09-16 DIAGNOSIS — E039 Hypothyroidism, unspecified: Secondary | ICD-10-CM | POA: Diagnosis not present

## 2020-09-16 DIAGNOSIS — R079 Chest pain, unspecified: Secondary | ICD-10-CM

## 2020-09-16 DIAGNOSIS — J449 Chronic obstructive pulmonary disease, unspecified: Secondary | ICD-10-CM | POA: Diagnosis not present

## 2020-09-16 DIAGNOSIS — F17210 Nicotine dependence, cigarettes, uncomplicated: Secondary | ICD-10-CM | POA: Diagnosis not present

## 2020-09-16 DIAGNOSIS — N179 Acute kidney failure, unspecified: Secondary | ICD-10-CM | POA: Insufficient documentation

## 2020-09-16 DIAGNOSIS — R0789 Other chest pain: Secondary | ICD-10-CM | POA: Diagnosis present

## 2020-09-16 DIAGNOSIS — Z79899 Other long term (current) drug therapy: Secondary | ICD-10-CM | POA: Insufficient documentation

## 2020-09-16 LAB — CBC
HCT: 36 % (ref 36.0–46.0)
Hemoglobin: 11 g/dL — ABNORMAL LOW (ref 12.0–15.0)
MCH: 24.1 pg — ABNORMAL LOW (ref 26.0–34.0)
MCHC: 30.6 g/dL (ref 30.0–36.0)
MCV: 78.9 fL — ABNORMAL LOW (ref 80.0–100.0)
Platelets: 304 10*3/uL (ref 150–400)
RBC: 4.56 MIL/uL (ref 3.87–5.11)
RDW: 15.7 % — ABNORMAL HIGH (ref 11.5–15.5)
WBC: 11.5 10*3/uL — ABNORMAL HIGH (ref 4.0–10.5)
nRBC: 0 % (ref 0.0–0.2)

## 2020-09-16 LAB — URINALYSIS, COMPLETE (UACMP) WITH MICROSCOPIC
Bilirubin Urine: NEGATIVE
Glucose, UA: NEGATIVE mg/dL
Hgb urine dipstick: NEGATIVE
Ketones, ur: NEGATIVE mg/dL
Leukocytes,Ua: NEGATIVE
Nitrite: NEGATIVE
Protein, ur: >=300 mg/dL — AB
Specific Gravity, Urine: 1.019 (ref 1.005–1.030)
pH: 6 (ref 5.0–8.0)

## 2020-09-16 LAB — BASIC METABOLIC PANEL
Anion gap: 9 (ref 5–15)
BUN: 14 mg/dL (ref 6–20)
CO2: 25 mmol/L (ref 22–32)
Calcium: 8.7 mg/dL — ABNORMAL LOW (ref 8.9–10.3)
Chloride: 102 mmol/L (ref 98–111)
Creatinine, Ser: 1.1 mg/dL — ABNORMAL HIGH (ref 0.44–1.00)
GFR, Estimated: >60 mL/min (ref >60)
Glucose, Bld: 122 mg/dL — ABNORMAL HIGH (ref 70–99)
Potassium: 3.9 mmol/L (ref 3.5–5.1)
Sodium: 136 mmol/L (ref 135–145)

## 2020-09-16 LAB — TROPONIN I (HIGH SENSITIVITY): Troponin I (High Sensitivity): 18 ng/L — ABNORMAL HIGH (ref <18)

## 2020-09-16 LAB — POC URINE PREG, ED: Preg Test, Ur: NEGATIVE

## 2020-09-16 MED ORDER — HYDRALAZINE HCL 25 MG PO TABS: 25.0000 mg | ORAL_TABLET | Freq: Three times a day (TID) | ORAL | 3 refills | Status: DC

## 2020-09-16 MED ORDER — METOPROLOL SUCCINATE ER 50 MG PO TB24: 50.0000 mg | ORAL_TABLET | Freq: Every day | ORAL | 3 refills | Status: DC

## 2020-09-16 MED ORDER — LEVOTHYROXINE SODIUM 50 MCG PO TABS
25.0000 ug | ORAL_TABLET | Freq: Once | ORAL | Status: AC
  Administered 2020-09-16: 25 ug via ORAL
  Filled 2020-09-16: qty 1

## 2020-09-16 MED ORDER — ALBUTEROL SULFATE HFA 108 (90 BASE) MCG/ACT IN AERS: 2.0000 | INHALATION_SPRAY | Freq: Four times a day (QID) | RESPIRATORY_TRACT | 2 refills | Status: DC | PRN

## 2020-09-16 MED ORDER — AMLODIPINE BESYLATE 5 MG PO TABS: 5.0000 mg | ORAL_TABLET | Freq: Every day | ORAL | 3 refills | Status: DC

## 2020-09-16 MED ORDER — AMLODIPINE BESYLATE 5 MG PO TABS
5.0000 mg | ORAL_TABLET | Freq: Once | ORAL | Status: AC
  Administered 2020-09-16: 5 mg via ORAL
  Filled 2020-09-16: qty 1

## 2020-09-16 MED ORDER — HYDRALAZINE HCL 25 MG PO TABS: 25.0000 mg | ORAL_TABLET | Freq: Three times a day (TID) | ORAL | 3 refills | Status: AC

## 2020-09-16 MED ORDER — LEVOTHYROXINE SODIUM 25 MCG PO TABS
25.0000 ug | ORAL_TABLET | Freq: Every day | ORAL | 3 refills | Status: DC
Start: 2020-09-16 — End: 2020-09-16

## 2020-09-16 MED ORDER — SPACER/AERO-HOLDING CHAMBERS DEVI: 0 refills | Status: AC

## 2020-09-16 MED ORDER — HYDROCHLOROTHIAZIDE 25 MG PO TABS: 25.0000 mg | ORAL_TABLET | Freq: Every day | ORAL | 3 refills | Status: DC

## 2020-09-16 MED ORDER — HYDROCHLOROTHIAZIDE 25 MG PO TABS
25.0000 mg | ORAL_TABLET | Freq: Once | ORAL | Status: AC
  Administered 2020-09-16: 25 mg via ORAL
  Filled 2020-09-16: qty 1

## 2020-09-16 MED ORDER — HYDROCHLOROTHIAZIDE 25 MG PO TABS: 25.0000 mg | ORAL_TABLET | Freq: Every day | ORAL | 3 refills | Status: AC

## 2020-09-16 MED ORDER — METOPROLOL TARTRATE 50 MG PO TABS
50.0000 mg | ORAL_TABLET | Freq: Once | ORAL | Status: AC
  Administered 2020-09-16: 50 mg via ORAL
  Filled 2020-09-16: qty 1

## 2020-09-16 MED ORDER — LEVOTHYROXINE SODIUM 25 MCG PO TABS
25.0000 ug | ORAL_TABLET | Freq: Every day | ORAL | 3 refills | Status: DC
Start: 2020-09-16 — End: 2020-11-22

## 2020-09-16 NOTE — ED Provider Notes (Signed)
[facility] Emergency Department Provider Note  Time seen: 8:14 AM  I have reviewed the triage vital signs and the nursing notes.   HISTORY  Chief Complaint Chest Pain   HPI Katrina Meyers is a 29 y.o. female with a past medical history of anxiety, depression, hypertension, obesity, hypothyroidism, presents to the emergency department for chest tightness.  According to the patient for the past several weeks she has been intermittently experiencing chest tightness.  Patient states for the past several months she has been out of all of her medications.  Patient came to the emergency department yesterday but left before being seen.  Was called back today saying her troponin/heart enzyme was elevated.  I reviewed the patient's work-up from yesterday, lab work showed renal dysfunction and troponin x2 of 19 and 20.  Patient's blood pressure currently 200/115 but the patient has not taken any of her blood pressure medications for the past several months per patient.  Past Medical History:  Diagnosis Date  . Anxiety 2019  . Borderline diabetic   . Chronic bronchitis (HCC)   . COPD (chronic obstructive pulmonary disease) (HCC)   . Depression 2019  . Hypertension   . IBS (irritable bowel syndrome)   . Morbid obesity (HCC)   . Reflux   . Thyroid disease     Patient Active Problem List   Diagnosis Date Noted  . Elevated blood pressure reading in office with diagnosis of hypertension 06/17/2018  . Hidradenitis suppurativa 06/04/2018  . Anxiety and depression 06/04/2018  . Amenorrhea, secondary 06/04/2018  . Class 3 severe obesity due to excess calories with serious comorbidity and body mass index (BMI) of 50.0 to 59.9 in adult (HCC) 09/10/2017  . Gastroesophageal reflux disease without esophagitis 09/10/2017  . Irritable bowel syndrome 09/10/2017  . Prediabetes 09/10/2017  . HTN (hypertension) 05/28/2017  . LGSIL on Pap smear of cervix 10/15/2016  . Hypothyroidism,  adult 06/28/2016  . Extremity pain 03/26/2014    Past Surgical History:  Procedure Laterality Date  . UPPER ENDOSCOPY W/ SCLEROTHERAPY      Prior to Admission medications   Medication Sig Start Date End Date Taking? Authorizing Provider  albuterol (PROVENTIL HFA;VENTOLIN HFA) 108 (90 Base) MCG/ACT inhaler Inhale 2-4 puffs by mouth every 4 hours as needed for wheezing, cough, and/or shortness of breath 10/09/17   Loleta Rose, MD  amLODipine (NORVASC) 5 MG tablet Take 1 tablet (5 mg total) by mouth daily. 09/18/19 09/17/20  Minna Antis, MD  buPROPion (WELLBUTRIN XL) 150 MG 24 hr tablet Take 1 tablet (150 mg total) by mouth daily. Patient not taking: Reported on 02/19/2019 06/04/18 06/04/19  Copland, Ilona Sorrel, PA-C  Cholecalciferol 1.25 MG (50000 UT) capsule Take by mouth. 01/12/20 01/11/21  [provider]  hydrochlorothiazide (HYDRODIURIL) 25 MG tablet Take 1 tablet (25 mg total) by mouth daily. 09/18/19   Minna Antis, MD  hydrOXYzine (ATARAX/VISTARIL) 25 MG tablet Take by mouth. 11/30/19   [provider]  levothyroxine (SYNTHROID, LEVOTHROID) 25 MCG tablet Take 1 tablet (25 mcg total) by mouth daily before breakfast. 09/30/18   Rockne Menghini, MD  medroxyPROGESTERone (PROVERA) 10 MG tablet Take 1 tablet (10 mg total) by mouth daily for 7 days. Q90 days if no spontaneous menses 01/14/20 01/21/20  Copland, Alicia B, PA-C  metoprolol succinate (TOPROL-XL) 50 MG 24 hr tablet Take by mouth. 01/12/20 01/11/21  [provider]  mometasone-formoterol (DULERA) 200-5 MCG/ACT AERO Inhale into the lungs. 11/30/19 11/29/20  [provider]  naproxen (NAPROSYN) 500 MG tablet Take by mouth.    [provider]  norethindrone (AYGESTIN) 5 MG tablet Take 1 tablet (5 mg total) by mouth daily for 14 days. 01/25/20 02/08/20  Copland, Ilona Sorrel, PA-C  omeprazole (PRILOSEC) 20 MG capsule Take 20 mg by mouth daily.    [provider]  Spacer/Aero Chamber  Mouthpiece MISC 1 Units by Does not apply route every 4 (four) hours as needed (wheezing). 09/09/17   Merrily Brittle, MD  Vitamin D, Ergocalciferol, (DRISDOL) 50000 units CAPS capsule Take 50,000 Units by mouth once a week. 06/05/18   [provider]    Allergies  Allergen Reactions  . Reglan [Metoclopramide] Other (See Comments)    Family History  Problem Relation Age of Onset  . Diabetes Mellitus II Mother   . Diabetes Mellitus II Father   . Breast cancer Maternal Aunt 66    Social History Social History   Tobacco Use  . Smoking status: Current Every Day Smoker    Packs/day: 0.30    Types: Cigarettes  . Smokeless tobacco: Never Used  . Tobacco comment: has called 1-800-quit now and is decreasing smoking  Vaping Use  . Vaping Use: Never used  Substance Use Topics  . Alcohol use: No  . Drug use: Yes    Types: Marijuana    Review of Systems Constitutional: Negative for fever. Cardiovascular: Intermittent mild chest tightness for the past several weeks Respiratory: Negative for shortness of breath. Gastrointestinal: Negative for abdominal pain, vomiting Musculoskeletal: Negative for musculoskeletal complaints Neurological: Negative for headache All other ROS negative  ____________________________________________   PHYSICAL EXAM:  VITAL SIGNS: ED Triage Vitals  Enc Vitals Group     BP 09/16/20 0730 (!) 200/115     Pulse Rate 09/16/20 0726 87     Resp 09/16/20 0726 16     Temp 09/16/20 0726 98.6 F (37 C)     Temp src --      SpO2 09/16/20 0726 98 %     Weight 09/16/20 0724 (!) 335 lb (152 kg)     Height 09/16/20 0724 5\' 2"  (1.575 m)     Head Circumference --      Peak Flow --      Pain Score 09/16/20 0724 8     Pain Loc --      Pain Edu? --      Excl. in GC? --    Constitutional: Alert and oriented. Well appearing and in no distress. Eyes: Normal exam ENT      Head: Normocephalic and atraumatic.      Mouth/Throat: Mucous membranes are  moist. Cardiovascular: Normal rate, regular rhythm.  Respiratory: Normal respiratory effort without tachypnea nor retractions. Breath sounds are clear Gastrointestinal: Soft and nontender. No distention.   Musculoskeletal: Nontender with normal range of motion in all extremities.  Neurologic:  Normal speech and language. No gross focal neurologic deficits  Skin:  Skin is warm, dry and intact.  Psychiatric: Mood and affect are normal.   ____________________________________________    EKG  EKG viewed and interpreted by myself shows a normal sinus rhythm 87 bpm the narrow QRS, normal axis, normal intervals, no concerning ST changes.  ____________________________________________    RADIOLOGY  Chest x-ray negative for acute abnormality.  ____________________________________________   INITIAL IMPRESSION / ASSESSMENT AND PLAN / ED COURSE  Pertinent labs & imaging results that were available during my care of the patient were reviewed by me and considered in my medical decision making (see  chart for details).   Patient presents emergency department for intermittent chest pain was called back saying that her heart enzyme was slightly elevated.  Patient states no chest tightness currently but states it has been intermittently occurring over the past few weeks.  In reviewing the patient's lab work from yesterday her creatinine had increased from around 1.0 to now 1.4, troponin was slightly elevated at 19 but repeat troponin III hours later was 20.  Patient's blood pressure today 200/115.  I highly suspect the patient's troponin elevation is more related to renal dysfunction and hypertension over ACS.  EKG is reassuring, chest x-ray from yesterday reassuring.  We will order the patient's home blood pressure medications in the emergency department.  Patient states she is on several medications and she is out of all of them, I will reviewed the patient's medications and provide refills for the  patient until the patient can see her PCP.  Patient agreeable to plan of care.  We will recheck lab work today including troponin to ensure no further elevation.  Lab work is reassuring.  Troponin unchanged.  Creatinine is actually significantly improved.  Patient has been given her home blood pressure medications.  I will write refills for the patient's medications.  She is to call her PCP today to arrange a follow-up appointment hopefully within next week or 2.  I discussed my typical chest pain return precautions.  Also discussed taking the patient's blood pressure at home or at a pharmacy as well as further return precautions.  Patient agreeable to plan of care.  TAKEIA CIARAVINO was evaluated in Emergency Department on 09/16/2020 for the symptoms described in the history of present illness. She was evaluated in the context of the global COVID-19 pandemic, which necessitated consideration that the patient might be at risk for infection with the SARS-CoV-2 virus that causes COVID-19. Institutional protocols and algorithms that pertain to the evaluation of patients at risk for COVID-19 are in a state of rapid change based on information released by regulatory bodies including the CDC and federal and state organizations. These policies and algorithms were followed during the patient's care in the ED.  ____________________________________________   FINAL CLINICAL IMPRESSION(S) / ED DIAGNOSES  Acute kidney injury Hypertension   Minna Antis, MD 09/16/20 [phone]

## 2020-09-16 NOTE — ED Notes (Signed)
AAOx3.  Skin warm and dry.  NAD.  Discussed patient BP with Dr. Lenard Lance and patient OK for discharge.

## 2020-09-16 NOTE — ED Notes (Signed)
Pt states that she has hx/o HTN. Pt has been out of her medication for about 1 month.

## 2020-09-16 NOTE — ED Notes (Signed)
Patient denies pain and is resting comfortably.  

## 2020-09-16 NOTE — ED Triage Notes (Signed)
Pt to ED via POv, pt came to ED early morning on 12/2, pt was here for central chest pain after a stressful situation at home. Pt left before she saw EDP, pt was called by Marisue Ivan to come back to ED because her troponin was elevated at 20. Pt reports that she is still having chest pain. Pain is in the center of chest. Pt reports that she does have some radiation of the pain into the right side of her chest and into her back. Pt states that she also has some shortness of breath. Pt denies N/V. Pt is able to speak in complete sentences at this time and is in NAD.

## 2020-10-23 ENCOUNTER — Emergency Department
Admission: EM | Admit: 2020-10-23 | Discharge: 2020-10-24 | Disposition: A | Discharge status: Home / Self Care | Payer: Medicaid Other | Attending: Emergency Medicine | Admitting: Emergency Medicine

## 2020-10-23 ENCOUNTER — Encounter: Payer: Self-pay | Admitting: Emergency Medicine

## 2020-10-23 ENCOUNTER — Other Ambulatory Visit: Payer: Self-pay

## 2020-10-23 DIAGNOSIS — M25511 Pain in right shoulder: Secondary | ICD-10-CM

## 2020-10-23 DIAGNOSIS — J449 Chronic obstructive pulmonary disease, unspecified: Secondary | ICD-10-CM | POA: Diagnosis not present

## 2020-10-23 DIAGNOSIS — I10 Essential (primary) hypertension: Secondary | ICD-10-CM | POA: Insufficient documentation

## 2020-10-23 DIAGNOSIS — E039 Hypothyroidism, unspecified: Secondary | ICD-10-CM | POA: Insufficient documentation

## 2020-10-23 DIAGNOSIS — F17210 Nicotine dependence, cigarettes, uncomplicated: Secondary | ICD-10-CM | POA: Insufficient documentation

## 2020-10-23 DIAGNOSIS — Z7951 Long term (current) use of inhaled steroids: Secondary | ICD-10-CM | POA: Insufficient documentation

## 2020-10-23 DIAGNOSIS — M7551 Bursitis of right shoulder: Secondary | ICD-10-CM | POA: Insufficient documentation

## 2020-10-23 DIAGNOSIS — Z79899 Other long term (current) drug therapy: Secondary | ICD-10-CM | POA: Diagnosis not present

## 2020-10-23 DIAGNOSIS — K219 Gastro-esophageal reflux disease without esophagitis: Secondary | ICD-10-CM | POA: Insufficient documentation

## 2020-10-23 NOTE — ED Triage Notes (Signed)
Patient with complaint of right shoulder pain times day and half. Patient states that normally has pain but that the pain is worse. Patient denies injury.

## 2020-10-24 MED ORDER — OXYCODONE-ACETAMINOPHEN 5-325 MG PO TABS
1.0000 | ORAL_TABLET | ORAL | 0 refills | Status: DC | PRN
Start: 2020-10-24 — End: 2021-05-22

## 2020-10-24 MED ORDER — OXYCODONE-ACETAMINOPHEN 5-325 MG PO TABS
1.0000 | ORAL_TABLET | Freq: Once | ORAL | Status: AC
  Administered 2020-10-24: 1 via ORAL
  Filled 2020-10-24: qty 1

## 2020-10-24 MED ORDER — IBUPROFEN 800 MG PO TABS: 800.0000 mg | ORAL_TABLET | Freq: Three times a day (TID) | ORAL | 0 refills | Status: AC | PRN

## 2020-10-24 MED ORDER — IBUPROFEN 800 MG PO TABS
800.0000 mg | ORAL_TABLET | Freq: Once | ORAL | Status: AC
  Administered 2020-10-24: 800 mg via ORAL
  Filled 2020-10-24: qty 1

## 2020-10-24 NOTE — ED Provider Notes (Signed)
[facility] Emergency Department Provider Note   ____________________________________________   Event Date/Time   First MD Initiated Contact with Patient 10/24/20 0340     (approximate)  I have reviewed the triage vital signs and the nursing notes.   HISTORY  Chief Complaint Shoulder Pain    HPI Katrina Meyers is a 30 y.o. female who presents to the ED from home with a chief complaint of nontraumatic right shoulder pain x1.5 days.  Patient is left-hand dominant.  States she often has pain in both shoulders but has worsening and increased pain in her right shoulder.  Exacerbated by movement.  Denies fall/injury/trauma.  Denies fever, cough, chest pain, shortness of breath, abdominal pain, nausea or vomiting.  Denies recent travel.     Past Medical History:  Diagnosis Date  . Anxiety 2019  . Borderline diabetic   . Chronic bronchitis (HCC)   . COPD (chronic obstructive pulmonary disease) (HCC)   . Depression 2019  . Hypertension   . IBS (irritable bowel syndrome)   . Morbid obesity (HCC)   . Reflux   . Thyroid disease     Patient Active Problem List   Diagnosis Date Noted  . Elevated blood pressure reading in office with diagnosis of hypertension 06/17/2018  . Hidradenitis suppurativa 06/04/2018  . Anxiety and depression 06/04/2018  . Amenorrhea, secondary 06/04/2018  . Class 3 severe obesity due to excess calories with serious comorbidity and body mass index (BMI) of 50.0 to 59.9 in adult (HCC) 09/10/2017  . Gastroesophageal reflux disease without esophagitis 09/10/2017  . Irritable bowel syndrome 09/10/2017  . Prediabetes 09/10/2017  . HTN (hypertension) 05/28/2017  . LGSIL on Pap smear of cervix 10/15/2016  . Hypothyroidism, adult 06/28/2016  . Extremity pain 03/26/2014    Past Surgical History:  Procedure Laterality Date  . UPPER ENDOSCOPY W/ SCLEROTHERAPY      Prior to Admission medications   Medication Sig Start Date End Date  Taking? Authorizing Provider  ibuprofen (ADVIL) 800 MG tablet Take 1 tablet (800 mg total) by mouth every 8 (eight) hours as needed for moderate pain. 10/24/20  Yes Irean Hong, MD  oxyCODONE-acetaminophen (PERCOCET/ROXICET) 5-325 MG tablet Take 1 tablet by mouth every 4 (four) hours as needed for severe pain. 10/24/20  Yes Irean Hong, MD  albuterol (PROVENTIL HFA;VENTOLIN HFA) 108 (90 Base) MCG/ACT inhaler Inhale 2-4 puffs by mouth every 4 hours as needed for wheezing, cough, and/or shortness of breath 10/09/17   Loleta Rose, MD  albuterol (VENTOLIN HFA) 108 (90 Base) MCG/ACT inhaler Inhale 2 puffs into the lungs every 6 (six) hours as needed for wheezing or shortness of breath. 09/16/20   Minna Antis, MD  amLODipine (NORVASC) 5 MG tablet Take 1 tablet (5 mg total) by mouth daily. 09/18/19 09/17/20  Minna Antis, MD  amLODipine (NORVASC) 5 MG tablet Take 1 tablet (5 mg total) by mouth daily. 09/16/20 01/14/21  Minna Antis, MD  buPROPion (WELLBUTRIN XL) 150 MG 24 hr tablet Take 1 tablet (150 mg total) by mouth daily. Patient not taking: Reported on 02/19/2019 06/04/18 06/04/19  Copland, Ilona Sorrel, PA-C  Cholecalciferol 1.25 MG (50000 UT) capsule Take by mouth. 01/12/20 01/11/21  [provider]  hydrALAZINE (APRESOLINE) 25 MG tablet Take 1 tablet (25 mg total) by mouth 3 (three) times daily. 09/16/20 01/14/21  Minna Antis, MD  hydrochlorothiazide (HYDRODIURIL) 25 MG tablet Take 1 tablet (25 mg total) by mouth daily. 09/18/19   Minna Antis, MD  hydrochlorothiazide (HYDRODIURIL)  25 MG tablet Take 1 tablet (25 mg total) by mouth daily. 09/16/20   Minna Antis, MD  hydrOXYzine (ATARAX/VISTARIL) 25 MG tablet Take by mouth. 11/30/19   [provider]  levothyroxine (SYNTHROID) 25 MCG tablet Take 1 tablet (25 mcg total) by mouth daily before breakfast. 09/16/20   Minna Antis, MD  levothyroxine (SYNTHROID, LEVOTHROID) 25 MCG tablet Take 1 tablet (25 mcg total)  by mouth daily before breakfast. 09/30/18   Rockne Menghini, MD  medroxyPROGESTERone (PROVERA) 10 MG tablet Take 1 tablet (10 mg total) by mouth daily for 7 days. Q90 days if no spontaneous menses 01/14/20 01/21/20  Copland, Helmut Muster B, PA-C  metoprolol succinate (TOPROL XL) 50 MG 24 hr tablet Take 1 tablet (50 mg total) by mouth daily. Take with or immediately following a meal. 09/16/20 01/14/21  Minna Antis, MD  metoprolol succinate (TOPROL-XL) 50 MG 24 hr tablet Take by mouth. 01/12/20 01/11/21  [provider]  mometasone-formoterol (DULERA) 200-5 MCG/ACT AERO Inhale into the lungs. 11/30/19 11/29/20  [provider]  naproxen (NAPROSYN) 500 MG tablet Take by mouth.    [provider]  norethindrone (AYGESTIN) 5 MG tablet Take 1 tablet (5 mg total) by mouth daily for 14 days. 01/25/20 02/08/20  Copland, Ilona Sorrel, PA-C  omeprazole (PRILOSEC) 20 MG capsule Take 20 mg by mouth daily.    [provider]  Spacer/Aero Chamber Mouthpiece MISC 1 Units by Does not apply route every 4 (four) hours as needed (wheezing). 09/09/17   Merrily Brittle, MD  Spacer/Aero-Holding Deretha Emory DEVI Please use spacer with albuterol inhaler, as needed. 09/16/20   Minna Antis, MD  Vitamin D, Ergocalciferol, (DRISDOL) 50000 units CAPS capsule Take 50,000 Units by mouth once a week. 06/05/18   [provider]    Allergies Reglan [metoclopramide]  Family History  Problem Relation Age of Onset  . Diabetes Mellitus II Mother   . Diabetes Mellitus II Father   . Breast cancer Maternal Aunt 68    Social History Social History   Tobacco Use  . Smoking status: Current Every Day Smoker    Packs/day: 0.30    Types: Cigarettes  . Smokeless tobacco: Never Used  . Tobacco comment: has called 1-800-quit now and is decreasing smoking  Vaping Use  . Vaping Use: Never used  Substance Use Topics  . Alcohol use: No  . Drug use: Not Currently    Types: Marijuana    Review of  Systems  Constitutional: No fever/chills Eyes: No visual changes. ENT: No sore throat. Cardiovascular: Denies chest pain. Respiratory: Denies shortness of breath. Gastrointestinal: No abdominal pain.  No nausea, no vomiting.  No diarrhea.  No constipation. Genitourinary: Negative for dysuria. Musculoskeletal: Positive for right shoulder pain.  Negative for back pain. Skin: Negative for rash. Neurological: Negative for headaches, focal weakness or numbness.   ____________________________________________   PHYSICAL EXAM:  VITAL SIGNS: ED Triage Vitals  Enc Vitals Group     BP 10/23/20 2348 (!) 195/109     Pulse Rate 10/23/20 2348 (!) 105     Resp 10/23/20 2348 18     Temp 10/23/20 2348 98.3 F (36.8 C)     Temp Source 10/23/20 2348 Oral     SpO2 10/23/20 2348 98 %     Weight 10/23/20 2349 (!) 340 lb (154.2 kg)     Height 10/23/20 2349 5\' 2"  (1.575 m)     Head Circumference --      Peak Flow --  Pain Score 10/23/20 2349 10     Pain Loc --      Pain Edu? --      Excl. in GC? --     Constitutional: Alert and oriented. Well appearing and in mild acute distress. Eyes: Conjunctivae are normal. PERRL. EOMI. Head: Atraumatic. Nose: No congestion/rhinnorhea. Mouth/Throat: Mucous membranes are moist.   Neck: No stridor.  No cervical spine tenderness to palpation. Cardiovascular: Normal rate, regular rhythm. Grossly normal heart sounds.  Good peripheral circulation. Respiratory: Normal respiratory effort.  No retractions. Lungs CTAB. Gastrointestinal: Soft and nontender. No distention. No abdominal bruits. No CVA tenderness. Musculoskeletal:  Right shoulder with mild swelling and tender to palpation.  Decreased active range of motion secondary to pain.  Full passive range of motion with some pain.  Mildly warm to the touch but no erythema.  Symmetrically warm limb compared to the left.  2+ radial pulse.  Brisk, less than 5-second capillary refill. No lower extremity  tenderness nor edema.  No joint effusions. Neurologic:  Normal speech and language. No gross focal neurologic deficits are appreciated. No gait instability. Skin:  Skin is warm, dry and intact. No rash noted. Psychiatric: Mood and affect are normal. Speech and behavior are normal.  ____________________________________________   LABS (all labs ordered are listed, but only abnormal results are displayed)  Labs Reviewed - No data to display ____________________________________________  EKG  None ____________________________________________  RADIOLOGY I, Tericka Devincenzi J, personally viewed and evaluated these images (plain radiographs) as part of my medical decision making, as well as reviewing the written report by the radiologist.  ED MD interpretation: None  Official radiology report(s): No results found.  ____________________________________________   PROCEDURES  Procedure(s) performed (including Critical Care):  Procedures   ____________________________________________   INITIAL IMPRESSION / ASSESSMENT AND PLAN / ED COURSE  As part of my medical decision making, I reviewed the following data within the electronic MEDICAL RECORD NUMBER Nursing notes reviewed and incorporated, Old chart reviewed, Notes from prior ED visits and [place] Controlled Substance Database     30 year old female presenting with nontraumatic right shoulder pain consistent with bursitis.  Will start NSAIDs, analgesia and patient will follow-up with orthopedics as needed.  Sling for comfort.  Strict return precautions given.  Patient verbalizes understanding and agrees with plan of care.      ____________________________________________   FINAL CLINICAL IMPRESSION(S) / ED DIAGNOSES  Final diagnoses:  Acute pain of right shoulder  Bursitis of right shoulder     ED Discharge Orders         Ordered    ibuprofen (ADVIL) 800 MG tablet  Every 8 hours PRN        10/24/20 0404    oxyCODONE-acetaminophen  (PERCOCET/ROXICET) 5-325 MG tablet  Every 4 hours PRN        10/24/20 0404          *Please note:  Joelene Millinekia L Hendershott was evaluated in Emergency Department on 10/24/2020 for the symptoms described in the history of present illness. She was evaluated in the context of the global COVID-19 pandemic, which necessitated consideration that the patient might be at risk for infection with the SARS-CoV-2 virus that causes COVID-19. Institutional protocols and algorithms that pertain to the evaluation of patients at risk for COVID-19 are in a state of rapid change based on information released by regulatory bodies including the CDC and federal and state organizations. These policies and algorithms were followed during the patient's care in the ED.  Some ED evaluations  and interventions may be delayed as a result of limited staffing during and the pandemic.*   Note:  This document was prepared using Dragon voice recognition software and may include unintentional dictation errors.   Irean Hong, MD 10/24/20 [phone]

## 2020-10-24 NOTE — Discharge Instructions (Addendum)
1.  Wear sling as needed for comfort. 2.  You may take pain medicines as needed (Motrin/Percocet #15). 3.  Return to the ER for worsening symptoms, persistent vomiting, difficulty breathing or other concerns.

## 2020-11-22 ENCOUNTER — Ambulatory Visit (INDEPENDENT_AMBULATORY_CARE_PROVIDER_SITE_OTHER): Payer: Medicaid Other | Admitting: Obstetrics and Gynecology

## 2020-11-22 ENCOUNTER — Other Ambulatory Visit: Payer: Self-pay

## 2020-11-22 ENCOUNTER — Encounter: Payer: Self-pay | Admitting: Obstetrics and Gynecology

## 2020-11-22 ENCOUNTER — Other Ambulatory Visit (HOSPITAL_COMMUNITY)
Admission: RE | Admit: 2020-11-22 | Discharge: 2020-11-22 | Disposition: A | Discharge status: Home / Self Care | Payer: Medicaid Other | Source: Ambulatory Visit | Attending: Obstetrics and Gynecology | Admitting: Obstetrics and Gynecology

## 2020-11-22 VITALS — BP 150/100 | Ht 62.0 in | Wt 339.0 lb

## 2020-11-22 DIAGNOSIS — R87612 Low grade squamous intraepithelial lesion on cytologic smear of cervix (LGSIL): Secondary | ICD-10-CM | POA: Insufficient documentation

## 2020-11-22 DIAGNOSIS — Z124 Encounter for screening for malignant neoplasm of cervix: Secondary | ICD-10-CM

## 2020-11-22 DIAGNOSIS — Z1151 Encounter for screening for human papillomavirus (HPV): Secondary | ICD-10-CM | POA: Insufficient documentation

## 2020-11-22 DIAGNOSIS — N898 Other specified noninflammatory disorders of vagina: Secondary | ICD-10-CM

## 2020-11-22 DIAGNOSIS — R350 Frequency of micturition: Secondary | ICD-10-CM | POA: Diagnosis not present

## 2020-11-22 LAB — POCT URINALYSIS DIPSTICK
Bilirubin, UA: NEGATIVE
Blood, UA: NEGATIVE
Glucose, UA: NEGATIVE
Ketones, UA: NEGATIVE
Leukocytes, UA: NEGATIVE
Nitrite, UA: NEGATIVE
Protein, UA: POSITIVE — AB
Spec Grav, UA: 1.02 (ref 1.010–1.025)
pH, UA: 5 (ref 5.0–8.0)

## 2020-11-22 NOTE — Patient Instructions (Signed)
I value your feedback and you entrusting us with your care. If you get a [facility] patient survey, I would appreciate you taking the time to let us know about your experience today. Thank you! ? ? ?

## 2020-11-22 NOTE — Progress Notes (Signed)
[place], [facility]   Chief Complaint  Patient presents with  . Vaginal Dryness    X few months  . Urinary Tract Infection    Urinary frequency and burning x couple of months    HPI:      [name] is a 30 y.o. G0P0000 whose LMP was Patient's last menstrual period was 11/11/2020 (exact date)., presents today for vaginal dryness for several months. Uses dial soap and dryer sheets. Is rarely sex active but has vaginal dryness during activity so stops. Hasn't tried lubricants. No increased vag d/c, odor, irritation. No meds to treat.   Having urinary issues. Has frequency, usually with good flow. No urgency, hematuria, LBP, pelvic pain fevers. Sometimes has frothy urine. Hx of proteinuria with normal BUN/Creatinine on CMPs. Has uncontrolled HTN, taking HCTZ. Drinks little water, lots of caffeine.   9/19 pap was LGSIL, neg colpo bx 10/19; repeat pap 5/20 was normal/neg HPV DNA. Repeat pap due  Menses have been monthly since 10/21, lasting 5-7 days, no BTB, mild dysmen. Used to have amenorrhea but menses regular since provera use last fall.   Past Medical History:  Diagnosis Date  . Anxiety 2019  . Borderline diabetic   . Chronic bronchitis (HCC)   . COPD (chronic obstructive pulmonary disease) (HCC)   . Depression 2019  . Hypertension   . IBS (irritable bowel syndrome)   . Morbid obesity (HCC)   . Reflux   . Thyroid disease     Past Surgical History:  Procedure Laterality Date  . UPPER ENDOSCOPY W/ SCLEROTHERAPY      Family History  Problem Relation Age of Onset  . Diabetes Mellitus II Mother   . Diabetes Mellitus II Father   . Breast cancer Maternal Aunt 46    Social History   Socioeconomic History  . Marital status: Single    Spouse name: Not on file  . Number of children: Not on file  . Years of education: Not on file  . Highest education level: Not on file  Occupational History  . Not on file  Tobacco Use  . Smoking status: Current Every  Day Smoker    Packs/day: 0.30    Types: Cigarettes  . Smokeless tobacco: Never Used  . Tobacco comment: has called 1-800-quit now and is decreasing smoking  Vaping Use  . Vaping Use: Never used  Substance and Sexual Activity  . Alcohol use: No  . Drug use: Not Currently    Types: Marijuana  . Sexual activity: Yes    Birth control/protection: None    Comment: same sex partner  Other Topics Concern  . Not on file  Social History Narrative  . Not on file   Social Determinants of Health   Financial Resource Strain: Not on file  Food Insecurity: Not on file  Transportation Needs: Not on file  Physical Activity: Not on file  Stress: Not on file  Social Connections: Not on file  Intimate Partner Violence: Not on file    Outpatient Medications Prior to Visit  Medication Sig Dispense Refill  . albuterol (PROVENTIL HFA;VENTOLIN HFA) 108 (90 Base) MCG/ACT inhaler Inhale 2-4 puffs by mouth every 4 hours as needed for wheezing, cough, and/or shortness of breath 1 Inhaler 1  . amLODipine (NORVASC) 10 MG tablet Take 1 tablet (10 mg total) by mouth once daily for 90 days    . Cholecalciferol 1.25 MG (50000 UT) capsule Take by mouth.    . hydrALAZINE (APRESOLINE)  25 MG tablet Take 1 tablet (25 mg total) by mouth 3 (three) times daily. 90 tablet 3  . hydrochlorothiazide (HYDRODIURIL) 25 MG tablet Take 1 tablet (25 mg total) by mouth daily. 30 tablet 3  . ibuprofen (ADVIL) 800 MG tablet Take 1 tablet (800 mg total) by mouth every 8 (eight) hours as needed for moderate pain. 15 tablet 0  . levothyroxine (SYNTHROID) 50 MCG tablet Take by mouth.    . metoprolol succinate (TOPROL-XL) 100 MG 24 hr tablet Take 100 mg by mouth daily.    . mometasone-formoterol (DULERA) 200-5 MCG/ACT AERO Inhale into the lungs.    Marland Kitchen omeprazole (PRILOSEC) 20 MG capsule Take 20 mg by mouth daily.    Marland Kitchen oxyCODONE-acetaminophen (PERCOCET/ROXICET) 5-325 MG tablet Take 1 tablet by mouth every 4 (four) hours as needed for  severe pain. 15 tablet 0  . Spacer/Aero-Holding Deretha Emory DEVI Please use spacer with albuterol inhaler, as needed. 1 Units 0  . Vitamin D, Ergocalciferol, (DRISDOL) 50000 units CAPS capsule Take 50,000 Units by mouth once a week.  1  . buPROPion (WELLBUTRIN XL) 150 MG 24 hr tablet Take 1 tablet (150 mg total) by mouth daily. (Patient not taking: Reported on 02/19/2019) 30 tablet 1  . albuterol (VENTOLIN HFA) 108 (90 Base) MCG/ACT inhaler Inhale 2 puffs into the lungs every 6 (six) hours as needed for wheezing or shortness of breath. 18 g 2  . amLODipine (NORVASC) 5 MG tablet Take 1 tablet (5 mg total) by mouth daily. 30 tablet 2  . amLODipine (NORVASC) 5 MG tablet Take 1 tablet (5 mg total) by mouth daily. 30 tablet 3  . hydrochlorothiazide (HYDRODIURIL) 25 MG tablet Take 1 tablet (25 mg total) by mouth daily. 30 tablet 2  . hydrOXYzine (ATARAX/VISTARIL) 25 MG tablet Take by mouth.    . levothyroxine (SYNTHROID) 25 MCG tablet Take 1 tablet (25 mcg total) by mouth daily before breakfast. 30 tablet 3  . levothyroxine (SYNTHROID, LEVOTHROID) 25 MCG tablet Take 1 tablet (25 mcg total) by mouth daily before breakfast. 30 tablet 0  . medroxyPROGESTERone (PROVERA) 10 MG tablet Take 1 tablet (10 mg total) by mouth daily for 7 days. Q90 days if no spontaneous menses 28 tablet 0  . metoprolol succinate (TOPROL XL) 50 MG 24 hr tablet Take 1 tablet (50 mg total) by mouth daily. Take with or immediately following a meal. 30 tablet 3  . metoprolol succinate (TOPROL-XL) 50 MG 24 hr tablet Take by mouth.    . naproxen (NAPROSYN) 500 MG tablet Take by mouth.    . norethindrone (AYGESTIN) 5 MG tablet Take 1 tablet (5 mg total) by mouth daily for 14 days. 14 tablet 0  . Spacer/Aero Chamber Mouthpiece MISC 1 Units by Does not apply route every 4 (four) hours as needed (wheezing). 1 each 0   No facility-administered medications prior to visit.      ROS:  Review of Systems  Constitutional: Negative for fever,  malaise/fatigue and weight loss.  Gastrointestinal: Negative for blood in stool, constipation, diarrhea, nausea and vomiting.  Genitourinary: Positive for frequency. Negative for dyspareunia, dysuria, flank pain, hematuria, urgency, vaginal bleeding, vaginal discharge and vaginal pain.  Musculoskeletal: Negative for back pain.  Skin: Negative for itching and rash.    OBJECTIVE:   Vitals:  BP (!) 150/100   Ht 5\' 2"  (1.575 m)   Wt (!) 339 lb (153.8 kg)   LMP 11/11/2020 (Exact Date)   BMI 62.00 kg/m   Physical Exam Vitals reviewed.  Constitutional:      Appearance: She is well-developed.  Pulmonary:     Effort: Pulmonary effort is normal.  Genitourinary:    General: Normal vulva.     Pubic Area: No rash.      Labia:        Right: No rash, tenderness or lesion.        Left: No rash, tenderness or lesion.      Vagina: Normal. No vaginal discharge, erythema or tenderness.     Cervix: Normal.     Uterus: Normal. Not enlarged and not tender.      Adnexa: Right adnexa normal and left adnexa normal.       Right: No mass or tenderness.         Left: No mass or tenderness.    Musculoskeletal:        General: Normal range of motion.     Cervical back: Normal range of motion.  Skin:    General: Skin is warm and dry.  Neurological:     General: No focal deficit present.     Mental Status: She is alert and oriented to person, place, and time.  Psychiatric:        Mood and Affect: Mood normal.        Behavior: Behavior normal.        Thought Content: Thought content normal.        Judgment: Judgment normal.     Results: Results for orders placed or performed in visit on 11/22/20 (from the past 24 hour(s))  POCT Urinalysis Dipstick     Status: Abnormal   Collection Time: 11/22/20  2:53 PM  Result Value Ref Range   Color, UA yellow    Clarity, UA clear    Glucose, UA Negative Negative   Bilirubin, UA neg    Ketones, UA neg    Spec Grav, UA 1.020 1.010 - 1.025   Blood, UA  neg    pH, UA 5.0 5.0 - 8.0   Protein, UA Positive (A) Negative   Urobilinogen, UA     Nitrite, UA neg    Leukocytes, UA Negative Negative   Appearance     Odor       Assessment/Plan: Urinary frequency - Plan: POCT Urinalysis Dipstick; neg UA. Most likely due to HCTZ and dehydration. D/c caffeine after 12 noon, increase water. Hx of proteinuria. F/u prn.   Vaginal dryness--neg exam. Most likely due to chem derm. D/C dial soap and dryer sheets. Dove sens skin soap/replens/coconut oil. Can use coconut oil for lubricant as well.   Cervical cancer screening - Plan: Cytology - PAP  Screening for HPV (human papillomavirus) - Plan: Cytology - PAP  LGSIL on Pap smear of cervix - Plan: Cytology - PAP    Return if symptoms worsen or fail to improve.  Aidynn Polendo B. Mauri Tolen, PA-C 11/22/2020 2:56 PM

## 2020-11-24 LAB — CYTOLOGY - PAP
Comment: NEGATIVE
Diagnosis: NEGATIVE
High risk HPV: NEGATIVE

## 2021-03-28 ENCOUNTER — Other Ambulatory Visit: Payer: Self-pay | Admitting: Physician Assistant

## 2021-03-28 ENCOUNTER — Other Ambulatory Visit (HOSPITAL_COMMUNITY): Payer: Self-pay | Admitting: Physician Assistant

## 2021-03-28 DIAGNOSIS — R221 Localized swelling, mass and lump, neck: Secondary | ICD-10-CM

## 2021-03-28 DIAGNOSIS — E039 Hypothyroidism, unspecified: Secondary | ICD-10-CM

## 2021-04-21 ENCOUNTER — Ambulatory Visit
Admission: RE | Admit: 2021-04-21 | Discharge: 2021-04-21 | Disposition: A | Discharge status: Home / Self Care | Payer: Medicaid Other | Source: Ambulatory Visit | Attending: Physician Assistant | Admitting: Physician Assistant

## 2021-04-21 ENCOUNTER — Other Ambulatory Visit: Payer: Self-pay

## 2021-04-21 DIAGNOSIS — R221 Localized swelling, mass and lump, neck: Secondary | ICD-10-CM | POA: Insufficient documentation

## 2021-04-21 DIAGNOSIS — E039 Hypothyroidism, unspecified: Secondary | ICD-10-CM | POA: Diagnosis present

## 2021-05-22 ENCOUNTER — Emergency Department
Admission: EM | Admit: 2021-05-22 | Discharge: 2021-05-22 | Disposition: A | Discharge status: Home / Self Care | Payer: Medicaid Other | Attending: Student in an Organized Health Care Education/Training Program | Admitting: Student in an Organized Health Care Education/Training Program

## 2021-05-22 ENCOUNTER — Encounter: Payer: Self-pay | Admitting: Emergency Medicine

## 2021-05-22 ENCOUNTER — Other Ambulatory Visit: Payer: Self-pay

## 2021-05-22 DIAGNOSIS — L732 Hidradenitis suppurativa: Secondary | ICD-10-CM | POA: Diagnosis not present

## 2021-05-22 DIAGNOSIS — E039 Hypothyroidism, unspecified: Secondary | ICD-10-CM | POA: Insufficient documentation

## 2021-05-22 DIAGNOSIS — I10 Essential (primary) hypertension: Secondary | ICD-10-CM | POA: Insufficient documentation

## 2021-05-22 DIAGNOSIS — Z79899 Other long term (current) drug therapy: Secondary | ICD-10-CM | POA: Insufficient documentation

## 2021-05-22 DIAGNOSIS — J449 Chronic obstructive pulmonary disease, unspecified: Secondary | ICD-10-CM | POA: Insufficient documentation

## 2021-05-22 DIAGNOSIS — F17210 Nicotine dependence, cigarettes, uncomplicated: Secondary | ICD-10-CM | POA: Insufficient documentation

## 2021-05-22 DIAGNOSIS — L089 Local infection of the skin and subcutaneous tissue, unspecified: Secondary | ICD-10-CM | POA: Diagnosis present

## 2021-05-22 MED ORDER — DOXYCYCLINE HYCLATE 100 MG PO TABS: 100.0000 mg | ORAL_TABLET | Freq: Two times a day (BID) | ORAL | 0 refills | Status: AC

## 2021-05-22 NOTE — ED Provider Notes (Signed)
[facility] Emergency Department Provider Note    Event Date/Time   First MD Initiated Contact with Patient 05/22/21 1348     (approximate)  I have reviewed the triage vital signs and the nursing notes.   HISTORY  Chief Complaint Wound Infection    HPI Katrina Meyers is a 30 y.o. female with a history of hidradenitis suppurativa presents to the ER for a boil that has since drained in the left lower quadrant.  No fevers or chills.  No recent antibiotics.  Has seen dermatology but does not have anyone managing her at bedtime.  Has been on clindamycin as well as doxycycline in the past.  Past Medical History:  Diagnosis Date   Anxiety 2019   Borderline diabetic    Chronic bronchitis (HCC)    COPD (chronic obstructive pulmonary disease) (HCC)    Depression 2019   Hypertension    IBS (irritable bowel syndrome)    Morbid obesity (HCC)    Reflux    Thyroid disease    Family History  Problem Relation Age of Onset   Diabetes Mellitus II Mother    Diabetes Mellitus II Father    Breast cancer Maternal Aunt 67   Past Surgical History:  Procedure Laterality Date   UPPER ENDOSCOPY W/ SCLEROTHERAPY     Patient Active Problem List   Diagnosis Date Noted   Elevated blood pressure reading in office with diagnosis of hypertension 06/17/2018   Hidradenitis suppurativa 06/04/2018   Anxiety and depression 06/04/2018   Amenorrhea, secondary 06/04/2018   Class 3 severe obesity due to excess calories with serious comorbidity and body mass index (BMI) of 50.0 to 59.9 in adult (HCC) 09/10/2017   Gastroesophageal reflux disease without esophagitis 09/10/2017   Irritable bowel syndrome 09/10/2017   Prediabetes 09/10/2017   HTN (hypertension) 05/28/2017   LGSIL on Pap smear of cervix 10/15/2016   Hypothyroidism, adult 06/28/2016   Extremity pain 03/26/2014      Prior to Admission medications   Medication Sig Start Date End Date Taking? Authorizing Provider   doxycycline (VIBRA-TABS) 100 MG tablet Take 1 tablet (100 mg total) by mouth 2 (two) times daily for 7 days. 05/22/21 05/29/21 Yes Willy Eddy, MD  albuterol (PROVENTIL HFA;VENTOLIN HFA) 108 [phone] Base) MCG/ACT inhaler Inhale 2-4 puffs by mouth every 4 hours as needed for wheezing, cough, and/or shortness of breath 10/09/17   Loleta Rose, MD  amLODipine (NORVASC) 10 MG tablet Take 1 tablet (10 mg total) by mouth once daily for 90 days 10/11/20   [provider]  hydrALAZINE (APRESOLINE) 25 MG tablet Take 1 tablet (25 mg total) by mouth 3 (three) times daily. 09/16/20 01/14/21  Minna Antis, MD  hydrochlorothiazide (HYDRODIURIL) 25 MG tablet Take 1 tablet (25 mg total) by mouth daily. 09/16/20   Minna Antis, MD  ibuprofen (ADVIL) 800 MG tablet Take 1 tablet (800 mg total) by mouth every 8 (eight) hours as needed for moderate pain. 10/24/20   Irean Hong, MD  levothyroxine (SYNTHROID) 50 MCG tablet Take by mouth. 02/22/20   [provider]  metoprolol succinate (TOPROL-XL) 100 MG 24 hr tablet Take 100 mg by mouth daily. 09/13/20   [provider]  mometasone-formoterol (DULERA) 200-5 MCG/ACT AERO Inhale into the lungs. 11/30/19 11/29/20  [provider]  omeprazole (PRILOSEC) 20 MG capsule Take 20 mg by mouth daily.    [provider]  Spacer/Aero-Holding Deretha Emory DEVI Please use spacer with albuterol inhaler, as needed. 09/16/20  Minna Antis, MD  Vitamin D, Ergocalciferol, (DRISDOL) 50000 units CAPS capsule Take 50,000 Units by mouth once a week. 06/05/18   [provider]    Allergies Reglan [metoclopramide]    Social History Social History   Tobacco Use   Smoking status: Every Day    Packs/day: 0.30    Types: Cigarettes   Smokeless tobacco: Never   Tobacco comments:    has called 1-800-quit now and is decreasing smoking  Vaping Use   Vaping Use: Never used  Substance Use Topics   Alcohol use: No   Drug use: Not  Currently    Types: Marijuana    Review of Systems Patient denies headaches, rhinorrhea, blurry vision, numbness, shortness of breath, chest pain, edema, cough, abdominal pain, nausea, vomiting, diarrhea, dysuria, fevers, rashes or hallucinations unless otherwise stated above in HPI. ____________________________________________   PHYSICAL EXAM:  VITAL SIGNS: Vitals:   05/22/21 1304  BP: (!) 153/77  Pulse: 82  Resp: 20  Temp: 99 F (37.2 C)  SpO2: 98%    Constitutional: Alert and oriented.  Eyes: Conjunctivae are normal.  Head: Atraumatic. Nose: No congestion/rhinnorhea. Mouth/Throat: Mucous membranes are moist.   Neck: No stridor. Painless ROM.  Cardiovascular: Normal rate, regular rhythm. Grossly normal heart sounds.  Good peripheral circulation. Respiratory: Normal respiratory effort.  No retractions. Lungs CTAB. Gastrointestinal: Soft and nontender. No distention. No abdominal bruits. No CVA tenderness. Genitourinary:  Musculoskeletal: No lower extremity tenderness nor edema.  No joint effusions. Neurologic:  Normal speech and language. No gross focal neurologic deficits are appreciated. No facial droop Skin: Less than 1cm area of open boil with no drainage no underlying fluctuance overlying erythema or crepitus.  Psychiatric: Mood and affect are normal. Speech and behavior are normal.  ____________________________________________   LABS (all labs ordered are listed, but only abnormal results are displayed)  No results found for this or any previous visit (from the past 24 hour(s)). ____________________________________________ ____________________________________________  RADIOLOGY   ____________________________________________   PROCEDURES  Procedure(s) performed:  Procedures    Critical Care performed: o ____________________________________________   INITIAL IMPRESSION / ASSESSMENT AND PLAN / ED COURSE  Pertinent labs & imaging results that were  available during my care of the patient were reviewed by me and considered in my medical decision making (see chart for details).   DDX: At bedtime, dermatitis, abscess, and STI  Katrina Meyers is a 30 y.o. who presents to the ED with presentation as described above.  Patient nontoxic-appearing afebrile hemodynamically stable.  Patient with chronic history of at bedtime with previously drained boil with no evidence of persistent fluctuance or undrained loculation.  Discussed options for treatment including referral to dermatology.  Will place on doxycycline.  Discussed signs and symptoms for which she should return to the ER.  Have discussed with the patient and available family all diagnostics and treatments performed thus far and all questions were answered to the best of my ability. The patient demonstrates understanding and agreement with plan.      The patient was evaluated in Emergency Department today for the symptoms described in the history of present illness. He/she was evaluated in the context of the global COVID-19 pandemic, which necessitated consideration that the patient might be at risk for infection with the SARS-CoV-2 virus that causes COVID-19. Institutional protocols and algorithms that pertain to the evaluation of patients at risk for COVID-19 are in a state of rapid change based on information released by regulatory bodies including the CDC and federal  and state organizations. These policies and algorithms were followed during the patient's care in the ED.  As part of my medical decision making, I reviewed the following data within the electronic MEDICAL RECORD NUMBER Nursing notes reviewed and incorporated, Labs reviewed, notes from prior ED visits and [place] Controlled Substance Database   ____________________________________________   FINAL CLINICAL IMPRESSION(S) / ED DIAGNOSES  Final diagnoses:  Hydradenitis      NEW MEDICATIONS STARTED DURING THIS VISIT:  New  Prescriptions   DOXYCYCLINE (VIBRA-TABS) 100 MG TABLET    Take 1 tablet (100 mg total) by mouth 2 (two) times daily for 7 days.     Note:  This document was prepared using Dragon voice recognition software and may include unintentional dictation errors.    Willy Eddy, MD 05/22/21 1410

## 2021-05-22 NOTE — ED Triage Notes (Signed)
Pt reports that she has Hidradenitis Supputativa and has what appears to have been a boil, that has know opened up and is open in her left lower abd. No s/s of infection. She is afraid she may have MRSA.

## 2021-05-22 NOTE — ED Notes (Signed)
See triage note    Presents with wound on abd   Provider in with pt on arrival to room

## 2022-08-18 ENCOUNTER — Emergency Department
Admission: EM | Admit: 2022-08-18 | Discharge: 2022-08-18 | Disposition: A | Discharge status: Home / Self Care | Payer: Medicaid Other | Attending: Emergency Medicine | Admitting: Emergency Medicine

## 2022-08-18 ENCOUNTER — Other Ambulatory Visit: Payer: Self-pay

## 2022-08-18 ENCOUNTER — Emergency Department: Payer: Medicaid Other

## 2022-08-18 DIAGNOSIS — Z79899 Other long term (current) drug therapy: Secondary | ICD-10-CM | POA: Insufficient documentation

## 2022-08-18 DIAGNOSIS — I10 Essential (primary) hypertension: Secondary | ICD-10-CM | POA: Diagnosis not present

## 2022-08-18 DIAGNOSIS — R0789 Other chest pain: Secondary | ICD-10-CM | POA: Insufficient documentation

## 2022-08-18 DIAGNOSIS — R079 Chest pain, unspecified: Secondary | ICD-10-CM

## 2022-08-18 DIAGNOSIS — J449 Chronic obstructive pulmonary disease, unspecified: Secondary | ICD-10-CM | POA: Insufficient documentation

## 2022-08-18 DIAGNOSIS — E039 Hypothyroidism, unspecified: Secondary | ICD-10-CM | POA: Diagnosis not present

## 2022-08-18 LAB — HEPATIC FUNCTION PANEL
ALT: 10 U/L (ref 0–44)
AST: 13 U/L — ABNORMAL LOW (ref 15–41)
Albumin: 3.1 g/dL — ABNORMAL LOW (ref 3.5–5.0)
Alkaline Phosphatase: 85 U/L (ref 38–126)
Bilirubin, Direct: <0.1 mg/dL (ref 0.0–0.2)
Total Bilirubin: 0.3 mg/dL (ref 0.3–1.2)
Total Protein: 8 g/dL (ref 6.5–8.1)

## 2022-08-18 LAB — CBC
HCT: 35.9 % — ABNORMAL LOW (ref 36.0–46.0)
Hemoglobin: 11.6 g/dL — ABNORMAL LOW (ref 12.0–15.0)
MCH: 27.5 pg (ref 26.0–34.0)
MCHC: 32.3 g/dL (ref 30.0–36.0)
MCV: 85.1 fL (ref 80.0–100.0)
Platelets: 323 10*3/uL (ref 150–400)
RBC: 4.22 MIL/uL (ref 3.87–5.11)
RDW: 15.9 % — ABNORMAL HIGH (ref 11.5–15.5)
WBC: 9.4 10*3/uL (ref 4.0–10.5)
nRBC: 0 % (ref 0.0–0.2)

## 2022-08-18 LAB — BASIC METABOLIC PANEL
Anion gap: 6 (ref 5–15)
BUN: 18 mg/dL (ref 6–20)
CO2: 28 mmol/L (ref 22–32)
Calcium: 9.4 mg/dL (ref 8.9–10.3)
Chloride: 102 mmol/L (ref 98–111)
Creatinine, Ser: 1.62 mg/dL — ABNORMAL HIGH (ref 0.44–1.00)
GFR, Estimated: 44 mL/min — ABNORMAL LOW (ref >60)
Glucose, Bld: 104 mg/dL — ABNORMAL HIGH (ref 70–99)
Potassium: 3.9 mmol/L (ref 3.5–5.1)
Sodium: 136 mmol/L (ref 135–145)

## 2022-08-18 LAB — TROPONIN I (HIGH SENSITIVITY)
Troponin I (High Sensitivity): 3 ng/L (ref <18)
Troponin I (High Sensitivity): 3 ng/L (ref <18)

## 2022-08-18 LAB — D-DIMER, QUANTITATIVE: D-Dimer, Quant: <0.27 ug/mL-FEU (ref 0.00–0.50)

## 2022-08-18 LAB — T4, FREE: Free T4: 0.82 ng/dL (ref 0.61–1.12)

## 2022-08-18 LAB — TSH: TSH: 1.69 u[IU]/mL (ref 0.350–4.500)

## 2022-08-18 LAB — LIPASE, BLOOD: Lipase: 32 U/L (ref 11–51)

## 2022-08-18 MED ORDER — FAMOTIDINE IN NACL 20-0.9 MG/50ML-% IV SOLN
20.0000 mg | Freq: Once | INTRAVENOUS | Status: AC
  Administered 2022-08-18: 20 mg via INTRAVENOUS
  Filled 2022-08-18: qty 50

## 2022-08-18 MED ORDER — SODIUM CHLORIDE 0.9 % IV BOLUS
1000.0000 mL | Freq: Once | INTRAVENOUS | Status: AC
  Administered 2022-08-18: 1000 mL via INTRAVENOUS

## 2022-08-18 MED ORDER — KETOROLAC TROMETHAMINE 30 MG/ML IJ SOLN
15.0000 mg | Freq: Once | INTRAMUSCULAR | Status: AC
  Administered 2022-08-18: 15 mg via INTRAVENOUS
  Filled 2022-08-18: qty 1

## 2022-08-18 NOTE — ED Notes (Addendum)
, °

## 2022-08-18 NOTE — ED Provider Notes (Addendum)
Patient signed out to me pending D-dimer repeat troponin and thyroid panel.  This is a 31 year old female with multiple medical comorbidities presenting with chest pain.  Work-up thus far has been reassuring, with normal chest x-ray and nonischemic EKG.  First troponin is negative.    Repeat troponin is negative.  D-dimer is negative.  Patient feeling tired on reassessment pain is improved.  We discussed primary follow-up and reasons to return to the ED.  She is appropriate for discharge. Rada Hay, MD 08/18/22 [id number]    Rada Hay, MD 08/18/22 [phone]

## 2022-08-18 NOTE — ED Triage Notes (Signed)
First Nurse Note:  Pt brought in by [facility] for c/o chest pain. Per EMS report pt woke 4 hours ago with L sided chest, neck, and jaw pain 8/10 described as pressure. Hx of GERD  HR 75 99% RA 147/109- hx of HTN  No meds en route with EMS

## 2022-08-18 NOTE — Discharge Instructions (Addendum)
Your work-up including your cardiac enzymes, D-dimer which is a screen for blood clot in the lungs and x-ray were all reassuring.  Please follow-up with your primary care provider if you continue to have symptoms.  If anything is worsening please return the emergency department.

## 2022-08-18 NOTE — ED Notes (Signed)
ThiS RN to bedside to d/c pt. Pt states "and wheres my ginger ale". Informed pt can get ginger ale after d/c. Pt explains upset because has been waiting for d/c. Attempted to explain to pt has not been able to drink d/t waiting on lab results and once lab results came back negative was then up for d/c. Pt then became very disrepectful to this RN. RN left room to get another RN to d/c

## 2022-08-18 NOTE — ED Provider Notes (Signed)
[facility] Provider Note    Event Date/Time   First MD Initiated Contact with Patient 08/18/22 0545     (approximate)   History   Chest Pain (C/o chest pain radiating to L jaw x last 4 hr and becoming worse. States has been having reflux  x month and lost of 20lb lbs (stated) )   HPI  Katrina Meyers is a 31 y.o. female brought to the ED via EMS from home with a chief complaint of chest pain.  Reports left-sided chest tightness radiating to her left jaw starting 4 hours ago.  History of GERD, hypertension, thyroid disease, COPD.  Denies associated diaphoresis, shortness of breath, palpitations, nausea/vomiting or dizziness.  Denies recent fever or cough.  Denies recent travel, trauma or hormone use.     Past Medical History   Past Medical History:  Diagnosis Date   Anxiety 2019   Borderline diabetic    Chronic bronchitis (HCC)    COPD (chronic obstructive pulmonary disease) ([place])    Depression 2019   Hypertension    IBS (irritable bowel syndrome)    Morbid obesity ([place])    Reflux    Thyroid disease      Active Problem List   Patient Active Problem List   Diagnosis Date Noted   Elevated blood pressure reading in office with diagnosis of hypertension 06/17/2018   Hidradenitis suppurativa 06/04/2018   Anxiety and depression 06/04/2018   Amenorrhea, secondary 06/04/2018   Class 3 severe obesity due to excess calories with serious comorbidity and body mass index (BMI) of 50.0 to 59.9 in adult ([place]) 09/10/2017   Gastroesophageal reflux disease without esophagitis 09/10/2017   Irritable bowel syndrome 09/10/2017   Prediabetes 09/10/2017   HTN (hypertension) 05/28/2017   LGSIL on Pap smear of cervix 10/15/2016   Hypothyroidism, adult 06/28/2016   Extremity pain 03/26/2014     Past Surgical History   Past Surgical History:  Procedure Laterality Date   UPPER ENDOSCOPY W/ SCLEROTHERAPY       Home Medications   Prior to Admission  medications   Medication Sig Start Date End Date Taking? Authorizing Provider  albuterol (PROVENTIL HFA;VENTOLIN HFA) 108 (90 Base) MCG/ACT inhaler Inhale 2-4 puffs by mouth every 4 hours as needed for wheezing, cough, and/or shortness of breath 10/09/17   Hinda Kehr, MD  amLODipine (NORVASC) 10 MG tablet Take 1 tablet (10 mg total) by mouth once daily for 90 days 10/11/20   [provider]  hydrALAZINE (APRESOLINE) 25 MG tablet Take 1 tablet (25 mg total) by mouth 3 (three) times daily. 09/16/20 01/14/21  Harvest Dark, MD  hydrochlorothiazide (HYDRODIURIL) 25 MG tablet Take 1 tablet (25 mg total) by mouth daily. 09/16/20   Harvest Dark, MD  ibuprofen (ADVIL) 800 MG tablet Take 1 tablet (800 mg total) by mouth every 8 (eight) hours as needed for moderate pain. 10/24/20   Paulette Blanch, MD  levothyroxine (SYNTHROID) 50 MCG tablet Take by mouth. 02/22/20   [provider]  metoprolol succinate (TOPROL-XL) 100 MG 24 hr tablet Take 100 mg by mouth daily. 09/13/20   [provider]  mometasone-formoterol (DULERA) 200-5 MCG/ACT AERO Inhale into the lungs. 11/30/19 11/29/20  [provider]  omeprazole (PRILOSEC) 20 MG capsule Take 20 mg by mouth daily.    [provider]  Spacer/Aero-Holding Josiah Lobo DEVI Please use spacer with albuterol inhaler, as needed. 09/16/20   Harvest Dark, MD  Vitamin D, Ergocalciferol, (DRISDOL) 50000 units CAPS capsule Take  50,000 Units by mouth once a week. 06/05/18   [provider]     Allergies  Reglan [metoclopramide]   Family History   Family History  Problem Relation Age of Onset   Diabetes Mellitus II Mother    Diabetes Mellitus II Father    Breast cancer Maternal Aunt 68     Physical Exam  Triage Vital Signs: ED Triage Vitals  Enc Vitals Group     BP 08/18/22 0515 (!) 148/102     Pulse Rate 08/18/22 0515 80     Resp 08/18/22 0515 20     Temp 08/18/22 0515 98.5 F (36.9 C)     Temp  Source 08/18/22 0515 Oral     SpO2 08/18/22 0515 97 %     Weight 08/18/22 0522 254 lb 3.1 oz (115.3 kg)     Height 08/18/22 0517 5\' 1"  (1.549 m)     Head Circumference --      Peak Flow --      Pain Score 08/18/22 0511 8     Pain Loc --      Pain Edu? --      Excl. in GC? --     Updated Vital Signs: BP 126/77 (BP Location: Right Arm)   Pulse 73   Temp 98.1 F (36.7 C) (Oral)   Resp 18   Ht 5\' 1"  (1.549 m)   Wt 115.3 kg   SpO2 100%   BMI 48.03 kg/m    General: Awake, no distress.  CV:  RRR.  Good peripheral perfusion.  Resp:  Normal effort.  CTA B. Abd:  Nontender.  No distention.  Other:  No thyromegaly.  Bilateral calves are nontender and nonswollen.   ED Results / Procedures / Treatments  Labs (all labs ordered are listed, but only abnormal results are displayed) Labs Reviewed  BASIC METABOLIC PANEL - Abnormal; Notable for the following components:      Result Value   Glucose, Bld 104 (*)    Creatinine, Ser 1.62 (*)    GFR, Estimated 44 (*)    All other components within normal limits  CBC - Abnormal; Notable for the following components:   Hemoglobin 11.6 (*)    HCT 35.9 (*)    RDW 15.9 (*)    All other components within normal limits  HEPATIC FUNCTION PANEL  LIPASE, BLOOD  TSH  T4, FREE  POC URINE PREG, ED  TROPONIN I (HIGH SENSITIVITY)  TROPONIN I (HIGH SENSITIVITY)     EKG  ED ECG REPORT I, Shihab States J, the attending physician, personally viewed and interpreted this ECG.   Date: 08/18/2022  EKG Time: 0514  Rate: 80  Rhythm: normal sinus rhythm  Axis: Normal  Intervals:none  ST&T Change: Nonspecific    RADIOLOGY I have independently visualized and interpreted patient's chest x-ray as well as noted the radiology interpretation:  X-ray: No acute cardiopulmonary process  Official radiology report(s): DG Chest 2 View  Result Date: 08/18/2022 CLINICAL DATA:  Chest pain EXAM: CHEST - 2 VIEW COMPARISON:  09/16/2020 FINDINGS: Normal heart  size and mediastinal contours. No acute infiltrate or edema. No effusion or pneumothorax. No acute osseous findings. IMPRESSION: No active cardiopulmonary disease. Electronically Signed   By: 13/01/2022 M.D.   On: 08/18/2022 05:58     PROCEDURES:  Critical Care performed: No  .1-3 Lead EKG Interpretation  Performed by: Tiburcio Pea, MD Authorized by: 13/01/2022, MD     Interpretation: normal  ECG rate:  80   ECG rate assessment: normal     Rhythm: sinus rhythm     Ectopy: none     Conduction: normal   Comments:     Patient placed on cardiac monitor to evaluate for arrhythmias    MEDICATIONS ORDERED IN ED: Medications  famotidine (PEPCID) IVPB 20 mg premix (20 mg Intravenous New Bag/Given 08/18/22 0648)  sodium chloride 0.9 % bolus 1,000 mL (1,000 mLs Intravenous New Bag/Given 08/18/22 0646)  ketorolac (TORADOL) 30 MG/ML injection 15 mg (15 mg Intravenous Given 08/18/22 0647)     IMPRESSION / MDM / ASSESSMENT AND PLAN / ED COURSE  I reviewed the triage vital signs and the nursing notes.                             31 year old female presenting with chest pain. Differential diagnosis includes, but is not limited to, ACS, aortic dissection, pulmonary embolism, cardiac tamponade, pneumothorax, pneumonia, pericarditis, myocarditis, GI-related causes including esophagitis/gastritis, and musculoskeletal chest wall pain.   I have personally reviewed patient's records and note a PCP visit on 08/09/2022 for nausea/vomiting, unintentional weight loss, nephropathy, polyarthralgia, CKD and hypothyroidism.  Labs were drawn at that time with slightly abnormal inflammatory markers and ANA.  Patient's presentation is most consistent with acute presentation with potential threat to life or bodily function.  The patient is on the cardiac monitor to evaluate for evidence of arrhythmia and/or significant heart rate changes.  Laboratory results thus far demonstrate normal WBC 10.4,  creatinine 1.62 which is decreased from labs done on 10/26 with creatinine 1.8.  Will initiate IV fluid hydration, IV ketorolac for pain, IV Pepcid.  Will reassess.  Clinical Course as of 08/18/22 0705  Sat Aug 18, 2022  0704 Updated patient on negative troponin.  Thyroid panel, repeat troponin, hepatic panel pending.  Care is transferred to Dr. Sidney Ace at change of shift.  Anticipate discharge home if labs are unremarkable. [JS]    Clinical Course User Index [JS] Irean Hong, MD     FINAL CLINICAL IMPRESSION(S) / ED DIAGNOSES   Final diagnoses:  Nonspecific chest pain     Rx / DC Orders   ED Discharge Orders     None        Note:  This document was prepared using Dragon voice recognition software and may include unintentional dictation errors.   Irean Hong, MD 08/18/22 [phone]

## 2022-10-25 ENCOUNTER — Other Ambulatory Visit: Payer: Self-pay

## 2022-10-25 DIAGNOSIS — R109 Unspecified abdominal pain: Secondary | ICD-10-CM | POA: Diagnosis present

## 2022-10-25 DIAGNOSIS — N189 Chronic kidney disease, unspecified: Secondary | ICD-10-CM | POA: Diagnosis not present

## 2022-10-25 DIAGNOSIS — K5732 Diverticulitis of large intestine without perforation or abscess without bleeding: Secondary | ICD-10-CM | POA: Diagnosis not present

## 2022-10-25 LAB — COMPREHENSIVE METABOLIC PANEL
ALT: 10 U/L (ref 0–44)
AST: 13 U/L — ABNORMAL LOW (ref 15–41)
Albumin: 3.2 g/dL — ABNORMAL LOW (ref 3.5–5.0)
Alkaline Phosphatase: 86 U/L (ref 38–126)
Anion gap: 7 (ref 5–15)
BUN: 26 mg/dL — ABNORMAL HIGH (ref 6–20)
CO2: 26 mmol/L (ref 22–32)
Calcium: 9.1 mg/dL (ref 8.9–10.3)
Chloride: 102 mmol/L (ref 98–111)
Creatinine, Ser: 1.73 mg/dL — ABNORMAL HIGH (ref 0.44–1.00)
GFR, Estimated: 40 mL/min — ABNORMAL LOW (ref >60)
Glucose, Bld: 94 mg/dL (ref 70–99)
Potassium: 4.2 mmol/L (ref 3.5–5.1)
Sodium: 135 mmol/L (ref 135–145)
Total Bilirubin: 0.4 mg/dL (ref 0.3–1.2)
Total Protein: 9 g/dL — ABNORMAL HIGH (ref 6.5–8.1)

## 2022-10-25 LAB — CBC WITH DIFFERENTIAL/PLATELET
Abs Immature Granulocytes: 0.06 10*3/uL (ref 0.00–0.07)
Basophils Absolute: 0 10*3/uL (ref 0.0–0.1)
Basophils Relative: 0 %
Eosinophils Absolute: 0.1 10*3/uL (ref 0.0–0.5)
Eosinophils Relative: 1 %
HCT: 35.6 % — ABNORMAL LOW (ref 36.0–46.0)
Hemoglobin: 11.6 g/dL — ABNORMAL LOW (ref 12.0–15.0)
Immature Granulocytes: 1 %
Lymphocytes Relative: 23 %
Lymphs Abs: 2.7 10*3/uL (ref 0.7–4.0)
MCH: 28.9 pg (ref 26.0–34.0)
MCHC: 32.6 g/dL (ref 30.0–36.0)
MCV: 88.6 fL (ref 80.0–100.0)
Monocytes Absolute: 1.1 10*3/uL — ABNORMAL HIGH (ref 0.1–1.0)
Monocytes Relative: 9 %
Neutro Abs: 7.7 10*3/uL (ref 1.7–7.7)
Neutrophils Relative %: 66 %
Platelets: 342 10*3/uL (ref 150–400)
RBC: 4.02 MIL/uL (ref 3.87–5.11)
RDW: 15.2 % (ref 11.5–15.5)
WBC: 11.7 10*3/uL — ABNORMAL HIGH (ref 4.0–10.5)
nRBC: 0 % (ref 0.0–0.2)

## 2022-10-25 LAB — URINALYSIS, ROUTINE W REFLEX MICROSCOPIC
Bilirubin Urine: NEGATIVE
Glucose, UA: >=500 mg/dL — AB
Hgb urine dipstick: NEGATIVE
Ketones, ur: NEGATIVE mg/dL
Leukocytes,Ua: NEGATIVE
Nitrite: NEGATIVE
Protein, ur: 30 mg/dL — AB
Specific Gravity, Urine: 1.013 (ref 1.005–1.030)
pH: 5 (ref 5.0–8.0)

## 2022-10-25 LAB — POC URINE PREG, ED: Preg Test, Ur: NEGATIVE

## 2022-10-25 LAB — LIPASE, BLOOD: Lipase: 39 U/L (ref 11–51)

## 2022-10-25 NOTE — ED Triage Notes (Signed)
Suprapubic abdominal pain described as pressure and "sensation of a front fart".   Describes urgency and frequent urination.

## 2022-10-25 NOTE — ED Provider Triage Note (Signed)
Emergency Medicine Provider Triage Evaluation Note  Katrina Meyers, a 32 y.o. female  was evaluated in triage.  Pt complains of suprapubic abdominal pain that feels like pressure.  She also describes some urinary urgency and frequency.  She denies any fevers, chills, or sweats.  Review of Systems  Positive: Pelvic pressure, urinary frequency/urgency Negative: NVD  Physical Exam  BP 125/81   Pulse 75   Temp 99 F (37.2 C)   Resp 18   Ht 5\' 1"  (1.549 m)   Wt 116.1 kg   SpO2 100%   BMI 48.37 kg/m  Gen:   Awake, Meyers distress   Resp:  Normal effort  MSK:   Moves extremities without difficulty  Other:    Medical Decision Making  Medically screening exam initiated at 8:54 PM.  Appropriate orders placed.  Katrina Meyers was informed that the remainder of the evaluation will be completed by another provider, this initial triage assessment does not replace that evaluation, and the importance of remaining in the ED until their evaluation is complete.  Patient to the ED for evaluation of suprapubic pain and pressure as well as some urinary symptoms including frequency and urgency.   Melvenia Needles, PA-C 10/25/22 2055

## 2022-10-26 ENCOUNTER — Emergency Department
Admission: EM | Admit: 2022-10-26 | Discharge: 2022-10-26 | Disposition: A | Discharge status: Home / Self Care | Payer: Medicaid Other | Attending: Emergency Medicine | Admitting: Emergency Medicine

## 2022-10-26 ENCOUNTER — Emergency Department: Payer: Medicaid Other

## 2022-10-26 DIAGNOSIS — K5792 Diverticulitis of intestine, part unspecified, without perforation or abscess without bleeding: Secondary | ICD-10-CM

## 2022-10-26 MED ORDER — DICYCLOMINE HCL 10 MG PO CAPS
10.0000 mg | ORAL_CAPSULE | Freq: Once | ORAL | Status: AC
  Administered 2022-10-26: 10 mg via ORAL
  Filled 2022-10-26: qty 1

## 2022-10-26 MED ORDER — IOHEXOL 350 MG/ML SOLN
75.0000 mL | Freq: Once | INTRAVENOUS | Status: AC | PRN
  Administered 2022-10-26: 75 mL via INTRAVENOUS

## 2022-10-26 MED ORDER — DROPERIDOL 2.5 MG/ML IJ SOLN
2.5000 mg | Freq: Once | INTRAMUSCULAR | Status: AC
  Administered 2022-10-26: 2.5 mg via INTRAVENOUS
  Filled 2022-10-26: qty 2

## 2022-10-26 NOTE — ED Notes (Signed)
Patient returned from CT at this time.

## 2022-10-26 NOTE — Discharge Instructions (Addendum)
Use Tylenol for pain and fevers.  Up to 1000 mg per dose, up to 4 times per day.  Do not take more than 4000 mg of Tylenol/acetaminophen within 24 hours..  Please focus on fiber intake, liquid diet and follow-up with your GI to discuss a colonoscopy  Below are the documented results of your CT scan to ensure that your GI providers have this information. IMPRESSION: 1. Acute uncomplicated sigmoid diverticulitis. Due to the degree of wall thickening, consider follow-up colonoscopy after treatment. 2. Constipation and diverticulosis. 3. 1.8 cm new right adrenal nodule. Recommend follow-up adrenal washout CT in 1 year. If stable for 1 year, no further follow-up imaging. See above for references. 4. Mildly prominent external iliac chain and ileocolic mesenteric lymph nodes, stable. 5. Mild cardiomegaly. 6. Hepatomegaly with mild hepatic steatosis. Improved steatosis compared with 2018.

## 2022-10-26 NOTE — ED Provider Notes (Signed)
[facility] Provider Note    Event Date/Time   First MD Initiated Contact with Patient 10/26/22 0127     (approximate)   History   Abdominal Pain   HPI  Katrina Meyers is a 32 y.o. female who presents to the ED for evaluation of Abdominal Pain   I reviewed [facility] visit from 1/9.  Morbidly obese patient with a history of GERD and bloating, lupus, CKD, IBS-C.  Prescribed famotidine and EGD/colonoscopy scheduled, CT scheduled.  Patient presents to the ED for evaluation of acute on chronic abdominal cramping, pain and bloating sensation.  Reports that she has had the symptoms for 10 years intermittently.  Reports a sensation of a "front fart, that I cannot pass."  Reports some urinary frequency, difficulty passing her stool.  No fevers.  No vaginal bleeding or discharge.  No sensation of prolapse  Physical Exam   Triage Vital Signs: ED Triage Vitals  Enc Vitals Group     BP 10/25/22 2040 125/81     Pulse Rate 10/25/22 2040 75     Resp 10/25/22 2040 18     Temp 10/25/22 2040 99 F (37.2 C)     Temp src --      SpO2 10/25/22 2040 100 %     Weight 10/25/22 2039 256 lb (116.1 kg)     Height 10/25/22 2039 5\' 1"  (1.549 m)     Head Circumference --      Peak Flow --      Pain Score 10/25/22 2039 10     Pain Loc --      Pain Edu? --      Excl. in [place]? --     Most recent vital signs: Vitals:   10/25/22 2040 10/26/22 0200  BP: 125/81 (!) 128/57  Pulse: 75 70  Resp: 18 18  Temp: 99 F (37.2 C) 97.9 F (36.6 C)  SpO2: 100% 99%    General: Awake, no distress.  Look systemically well CV:  Good peripheral perfusion.  Resp:  Normal effort.  Abd:  No distention.  Diffuse and mild tenderness without localizing or peritoneal features. MSK:  No deformity noted.  Neuro:  No focal deficits appreciated. Other:     ED Results / Procedures / Treatments   Labs (all labs ordered are listed, but only abnormal results are displayed) Labs Reviewed   URINALYSIS, ROUTINE W REFLEX MICROSCOPIC - Abnormal; Notable for the following components:      Result Value   Color, Urine STRAW (*)    APPearance CLEAR (*)    Glucose, UA >=500 (*)    Protein, ur 30 (*)    Bacteria, UA RARE (*)    All other components within normal limits  CBC WITH DIFFERENTIAL/PLATELET - Abnormal; Notable for the following components:   WBC 11.7 (*)    Hemoglobin 11.6 (*)    HCT 35.6 (*)    Monocytes Absolute 1.1 (*)    All other components within normal limits  COMPREHENSIVE METABOLIC PANEL - Abnormal; Notable for the following components:   BUN 26 (*)    Creatinine, Ser 1.73 (*)    Total Protein 9.0 (*)    Albumin 3.2 (*)    AST 13 (*)    GFR, Estimated 40 (*)    All other components within normal limits  LIPASE, BLOOD  POC URINE PREG, ED    EKG   RADIOLOGY CT abdomen/pelvis interpreted by me without evidence of SBO  Official radiology  report(s): CT ABDOMEN PELVIS W CONTRAST  Result Date: 10/26/2022 CLINICAL DATA:  Lower abdominal pain.  32 year old female. EXAM: CT ABDOMEN AND PELVIS WITH CONTRAST TECHNIQUE: Multidetector CT imaging of the abdomen and pelvis was performed using the standard protocol following bolus administration of intravenous contrast. RADIATION DOSE REDUCTION: This exam was performed according to the departmental dose-optimization program which includes automated exposure control, adjustment of the mA and/or kV according to patient size and/or use of iterative reconstruction technique. CONTRAST:  46mL OMNIPAQUE IOHEXOL 350 MG/ML SOLN COMPARISON:  CT with IV contrast 06/23/2017. FINDINGS: Lower chest: There is mild cardiomegaly. The lung bases are clear, with chronic mild elevation of right hemidiaphragm. Hepatobiliary: Enlarged liver measuring 24 cm length, mildly steatotic. No mass enhancement is seen. The liver steatosis has improved since 2018. The gallbladder and bile ducts are unremarkable. Pancreas: No abnormality. Spleen: No  abnormal enhancement. Slightly prominent spleen measuring 13.6 cm AP. Adrenals/Urinary Tract: There is new demonstration of a 1.8 cm right adrenal nodule, Hounsfield density 47.4. Recommend follow-up adrenal washout CT in 1 year. If stable for ? 1 year, no further follow-up imaging. JACR 2017 Aug; 14(8):1038-44, JCAT 2016 Mar-Apr; 40(2):194-200, Urol J 2006 Spring; 3(2):71-4. There is no left adrenal mass. There is homogeneous bilateral renal cortical enhancement. There is no urinary stone or obstruction. No bladder thickening is seen. Stomach/Bowel: The stomach and small bowel are unremarkable. The appendix is normal. There is moderate stool retention ascending and transverse colon sigmoid diverticulosis. Wall thickening and inflammatory stranding is seen along proximal half of the sigmoid segment consistent with acute diverticulitis. Remainder of the colon wall is normal in thickness. There is no free air or diverticular abscess. Vascular/Lymphatic: No significant vascular findings are present. There are mildly prominent bilateral external iliac chain nodes, largest on the left is 1.1 cm in short axis, largest on the right is 1 cm in short axis. There are chronic borderline prominent ileocolic mesenteric lymph nodes, also stable. There is no new or worsening adenopathy. Reproductive: Uterus and bilateral adnexa are unremarkable. Other: There is no free fluid, free air, free hemorrhage or abscess. There are no incarcerated hernias. Musculoskeletal: No acute or significant osseous findings. IMPRESSION: 1. Acute uncomplicated sigmoid diverticulitis. Due to the degree of wall thickening, consider follow-up colonoscopy after treatment. 2. Constipation and diverticulosis. 3. 1.8 cm new right adrenal nodule. Recommend follow-up adrenal washout CT in 1 year. If stable for 1 year, no further follow-up imaging. See above for references. 4. Mildly prominent external iliac chain and ileocolic mesenteric lymph nodes, stable.  5. Mild cardiomegaly. 6. Hepatomegaly with mild hepatic steatosis. Improved steatosis compared with 2018. Electronically Signed   By: Telford Nab M.D.   On: 10/26/2022 03:05    PROCEDURES and INTERVENTIONS:  Procedures  Medications  droperidol (INAPSINE) 2.5 MG/ML injection 2.5 mg (2.5 mg Intravenous Given 10/26/22 0220)  dicyclomine (BENTYL) capsule 10 mg (10 mg Oral Given 10/26/22 0217)  iohexol (OMNIPAQUE) 350 MG/ML injection 75 mL (75 mLs Intravenous Contrast Given 10/26/22 0234)     IMPRESSION / MDM / ASSESSMENT AND PLAN / ED COURSE  I reviewed the triage vital signs and the nursing notes.  Differential diagnosis includes, but is not limited to, IBS, cystitis, appendicitis, diverticulitis, IBD  {Patient presents with symptoms of an acute illness or injury that is potentially life-threatening.  Morbidly obese patient presents with acute on chronic abdominal pain.  Normal vitals.  Blood work with a mild leukocytosis.  CKD around baseline her metabolic panel without any worsening.  Normal lipase.  Urine without infectious features.  We will provide analgesia, antiemetics and CT abdomen/pelvis.    Clinical Course as of 10/26/22 0357  Caleen Essex Oct 26, 2022  0322 Reassessed.  Resting comfortably.  Discussed diverticulitis.  Discussed bowel rest, liquid diet and GI follow-up.  She is in agreement of plan of care. [DS]    Clinical Course User Index [DS] Delton Prairie, MD     FINAL CLINICAL IMPRESSION(S) / ED DIAGNOSES   Final diagnoses:  Diverticulitis     Rx / DC Orders   ED Discharge Orders     None        Note:  This document was prepared using Dragon voice recognition software and may include unintentional dictation errors.   Delton Prairie, MD 10/26/22 [phone]

## 2024-02-15 ENCOUNTER — Other Ambulatory Visit: Payer: Self-pay

## 2024-02-15 ENCOUNTER — Emergency Department
Admission: EM | Admit: 2024-02-15 | Discharge: 2024-02-15 | Disposition: A | Discharge status: Home / Self Care | Payer: MEDICAID | Attending: Emergency Medicine | Admitting: Emergency Medicine

## 2024-02-15 DIAGNOSIS — L609 Nail disorder, unspecified: Secondary | ICD-10-CM | POA: Insufficient documentation

## 2024-02-15 DIAGNOSIS — I10 Essential (primary) hypertension: Secondary | ICD-10-CM | POA: Insufficient documentation

## 2024-02-15 HISTORY — DX: Systemic lupus erythematosus, unspecified: M32.9

## 2024-02-15 HISTORY — DX: Bronchitis, not specified as acute or chronic: J40

## 2024-02-15 HISTORY — DX: Other chest pain: R07.89

## 2024-02-15 HISTORY — DX: Gastro-esophageal reflux disease without esophagitis: K21.9

## 2024-02-15 HISTORY — DX: Type 2 diabetes mellitus without complications: E11.9

## 2024-02-15 HISTORY — DX: Obstructive sleep apnea (adult) (pediatric): G47.33

## 2024-02-15 NOTE — ED Provider Notes (Signed)
 [facility] Provider Note    Event Date/Time   First MD Initiated Contact with Patient 02/15/24 1628     (approximate)   History   Nail Problem   HPI  Katrina Meyers is a 33 y.o. female with PMH of hypertension, GERD, HS, anxiety and depression, IBS who presents for evaluation of a nail problem.  Patient states that her fingernail appears to be falling off.  Patient has long fake nails.  She does not recall any specific injury to her nails.      Physical Exam   Triage Vital Signs: ED Triage Vitals  Encounter Vitals Group     BP 02/15/24 1614 (!) 175/108     Systolic BP Percentile --      Diastolic BP Percentile --      Pulse Rate 02/15/24 1614 (!) 107     Resp 02/15/24 1614 20     Temp 02/15/24 1614 99.2 F (37.3 C)     Temp Source 02/15/24 1614 Oral     SpO2 02/15/24 1614 100 %     Weight 02/15/24 1620 300 lb (136.1 kg)     Height 02/15/24 1617 5\' 2"  (1.575 m)     Head Circumference --      Peak Flow --      Pain Score 02/15/24 1611 10     Pain Loc --      Pain Education --      Exclude from Growth Chart --     Most recent vital signs: Vitals:   02/15/24 1614  BP: (!) 175/108  Pulse: (!) 107  Resp: 20  Temp: 99.2 F (37.3 C)  SpO2: 100%   General: Awake, tearful. CV:  Good peripheral perfusion.  Resp:  Normal effort.  Abd:  No distention.  Other:  Fingernail on the ring finger of the left hand appears to be separated from the nailbed, fingernail on the middle finger on the left hand has some dried blood surrounding it.   ED Results / Procedures / Treatments   Labs (all labs ordered are listed, but only abnormal results are displayed) Labs Reviewed - No data to display  PROCEDURES:  Critical Care performed: No  Procedures   MEDICATIONS ORDERED IN ED: Medications - No data to display   IMPRESSION / MDM / ASSESSMENT AND PLAN / ED COURSE  I reviewed the triage vital signs and the nursing notes.                              33 year old female presents for evaluation of a nail problem.  Blood pressure was elevated today and patient was tachycardic.  Vital signs stable otherwise.  Patient is anxious and tearful on exam.  Differential diagnosis includes, but is not limited to, onychomycosis, paronychia, nailbed injury.  Patient's presentation is most consistent with acute, uncomplicated illness.  I explained to the patient that she will likely lose the nail on the ring finger as it appears there is damage to the nailbed.  She may or may not lose the nail on the middle finger as it appears the nailbed is intact but patient states that it is lifting up and away from her finger in the back.  I advised her to either have the fake nails removed or to trim them down as close to the natural nail as possible to prevent it from catching on something and further injury.  No signs  of infection at this time.  In regards to patient's blood pressure, she states that she does take blood pressure medication regularly and has taken her blood pressure meds today.  Patient states that "it has been quite a day".  She says that she has been very anxious today.  She did not want workup for her blood pressure.  I placed Band-Aids over the fingers per patient's request.  All questions were answered and she was discharged in stable condition.      FINAL CLINICAL IMPRESSION(S) / ED DIAGNOSES   Final diagnoses:  Nail problem     Rx / DC Orders   ED Discharge Orders     None        Note:  This document was prepared using Dragon voice recognition software and may include unintentional dictation errors.   Phyliss Breen, PA-C 02/15/24 1645    Lind Repine, MD 02/15/24 [phone]

## 2024-02-15 NOTE — ED Triage Notes (Signed)
 Pt to ED for nail problem. Pt has fake nails, several of them are coming off and she believes one may have an infx. One of them has some blood under it. Noticed yesterday. Pt rates pain 10/10. Pt crying in triage. Pt then told this RN when asking health history questions to update PMH, "I know you're asking if I'm diabetic because of my weight". Pt walked outside after triage.

## 2024-02-15 NOTE — ED Notes (Signed)
 Pt was discharged by provider

## 2024-02-15 NOTE — Discharge Instructions (Signed)
 Please get the tips removed or trip them back to your natural nail as close as possible. You may lose your nail. Try to keep it clean and cover with a bandage.
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
## 2024-07-01 NOTE — Progress Notes (Signed)
 Pt to CT

## 2024-07-01 NOTE — Progress Notes (Signed)
 Pt's wife at bedside. Pt agreeable to medications and being reconnected to ccm and o2 monitoring. Pt states they would like to speak to social work regarding housing resources, CM consulted. Call bell within reach, denies any further needs at this time.

## 2024-07-01 NOTE — Progress Notes (Signed)
 Pt found walking in the hallway looking for bathroom, per attending C-spine is cleared. Pt refusing care from this RN at this time.

## 2024-07-01 NOTE — Progress Notes (Signed)
 Pt to XR wih RN
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
## 2024-07-01 NOTE — Consults (Signed)
 ED Case Management Consult Note:   Reason for Consult: Homeless Resources  ED CM completed a records review, met with pt at bedside B49  Explained case management role and responsibilities.  Pt understands and is agreeable to assessment at this time.   CM met with patient at bedside. Patient states she's has a SW/ Paramedic- CST who is assisting with housing but it is a process. Patient states she called a friend who is supposedly coming to get her and her friend who is also at bedside. Patient was provided with resources as requested. If patient were to need assistance with getting to friends home if transportation is not available, CM is able to assist.  Living Situation/Home Layout/Support Systems in Place: Homeless   Family Contacts/POA:  Extended Emergency Contact Information Primary Emergency Contact: Powell,Jacqueline Address: [address]          [place], [place] [address] [place] Phone: [phone] Relation: Spouse Secondary Emergency Contact: Bartolo President Address: [address]          [place], [place] [address] [place] Home Phone: [phone] Mobile Phone: [phone] Relation: Parent   Prior Functional Level: Independent  Resources:  Crisis Resources  Mental Health Services Eligibility  NCDHHS LME/MCO Directory http://www.price-[email or web address] The NCDHHS LME/MCO directory provides access information on all 7 LME/MCOs for [place] including mental health crisis information. The LME/MCOs services can be accessed by any [place]  resident that is uninsured or a Medicaid recipient.   [facility] [address] [place], [place] [address] Phone: [phone] Fax: [phone] Crisis Line: [phone] LME/MCO for [place], [place], [place], and [place] counties. Provides coordinated access to mental health services, including crisis services for Medicaid recipients and the uninsured. Available for  residents of [place], [place], [place], and [place] counties with Medicaid or no insurance.   [facility] [address] [place], [place], [place] [address] P: [phone] ext.5 ThisMLS.nl Medication bridges to fill any gaps between provider appointments, Medication psychoeducation, Crisis counseling, Easy access for clients seeking to enroll in mental health services for the first time Provides services to clients ages 4 and above who have [facility] Medicaid or are uninsured and reside in [place], [place], [place], or [facility]  Crisis Solutions [place]  https://www.hunter.org/ Administrator, sports providing mental health crisis resources for each [employer]. Information can be provided and utilized by anyone.   [facility] [address], [place], [place] [address] P: [phone] 24/7 mental health crisis center for [facility] residents.  [facility] residents.  [facility] [address], [place], [place] [address] P: [phone] [email or web address] Comprehensive mental health evaluations, Individual, family, and group treatment, substance abuse treatment, psychiatric treatment.  Ideal for Latino population and Spanish speaking clients.   Substance Abuse Services Eligibility  [facility] [address] [place], [place] [address] P: [phone] 24/7 mental health crisis center for [facility] residents. [facility] residents.  [facility] [address], [place], [place] [address] P: [phone] JokeRule.[email or web address].uk Two year residential treatment program those dealing with substance use concerns. Client must have a substance use disorder and be committed to two year program. The individual seeking treatment must self-refer.   [facility] [address] [place], [place] [address] [facility] [address] [place], [place] [address] P: [phone] [email or web address]-[email or web address] Offers a long-term recovery program, overnight shelter, non-medical detox, family services.   COVID-19 plan - accepting new clients to remote detox their remote detox  facilities.  Programs offered to both men and women. No one is turned away as long as space is available.   [place]  [employer] https://www.oxfordvacancies.com/  Provides housing across the state specifically for men or women recovering from a substance use disorder. Housing rates vary by location. Over the age of 38; recovering from a substance use disorder; willing to abide by the rules created by the specific house. Availability per city can be found on their website. Client must complete phone interview.  [facility] [address] [place], [place] [address] P: [phone] [facility] and [place] [employer]: [phone] [email or web address]://[email or web address] Offers support services to people experiencing a mental health and/or substance abuse crisis. Services include: detox, short-term intensive observation, crisis stabilization and more. Provides services in [place], [place], [place], [place], [place], [place], [place], [place] and [place] counties and treat individuals from across the state.  Alcoholics Anonymous P: [phone] http://monroe.info/ Support meetings for individuals with alcohol use disorders.  Open to all, clients can access meeting schedules through website of mobile app.   Narcotics Anonymous P: [phone] https://ncregion-na.org/ Support meetings for individuals with narcotic addiction. Open to all, clients can access meeting schedules/info through website or by phone.  Al-Anon Family Groups P: [phone] https://al-anon.org/al-anon-meetings/ Support meetings for family members of those with alcohol use disorders. Open to all, those interested can access meeting schedules/info through  website or by phone.  Health  Services Eligibility   [facility] [address] [place], [place] Main Number [phone] For appointments [phone] [email or web address] Provides a Primary Care Medical Home to underserved residents of [facility].  [facility] residents who are underinsured/uninsured.   Health Resources & Services Administration CinemaBonus.fr National search engine for locating federally funded health clinics to connect underinsured/uninsured patients to primary care. Federally funded health centers are for individuals that are uninsured or underinsured that need access to primary care services.   [facility] [address], [place] [address] P: [phone] https://www.caare-[email or web address]/ Health and wellness services, Substance abuse care, Support groups, transitional housing, case management services. Clients interested in receiving services can submit an application through their website.  [facility] [place] [address] [place] [address] P: [phone] [email or web address] Primary medical care for adults and children, prescription assistance programs, optometry and eyeglass prescriptions, preventative care. Household income must be below 300% of federal poverty guidelines.   [facility] [address], [place], [place] [address] P: [phone] [email or web address] Providing public health services to [facility] residents including dental clinics, nutritional health services, maternity clinic, women's health services, pharmacy, health education.  Services for [facility] residents.  [facility] [address] [place] [address] P: [phone] [email or web address]://www.aas-[email or web address]/ Provides free testing, contraceptives, and HIV/AIDS counseling. They also provide a food pantry for those affected by HIV/AIDS and/or low income individuals. Any  member of the community affected by HIV/AIDS or considered low income. Multiple locations available in the [place] area.  [facility] [address] [place], [place] [address] P: [phone]  Toll-Free: [phone]   Pharmacy: [phone] Fax: [phone]  http://[email or web address] Provides free prescription medication to [place]  residents who are low-income, uninsured and fall at or below 200% of the Federal Poverty Level. Available for [place] residents that fall below 200% of the federal poverty guidelines. Available formulary and application can be accessed on website.  [facility] DSS - [place] [address] [place], [place], [place] [address] P: [phone] [email or web address]://www.[email or web address]-[email or web address] Assists eligible individuals with obtaining Medicaid. Applicants  must meet current income guidelines. Applicants can apply online, print an application from their website, or obtain an application onsite.  Domestic Violence/Sexual Assault Services Eligibility  [facility] [address] [place], [place], [place] [address] P: [phone]  Provides 24/7 Helpline, Information and Case Management, Counseling, Survivor groups, Court advocacy, Aeronautical engineer, Emergency Shelter to victims of domestic violence and/or sexual assault. Services are free and confidential regardless of gender, gender-identity, immigration status, or sexual orientation.   [facility] [phone] [place]  [phone] P: [phone] Offers culturally specific help to Hispanic/Latina women who are victims of domestic violence, sexual assault, or human trafficking. Offers job counseling and life Magazine features editor.  Services are designed for Latina women.  [facility] [address] [place], [place] [address] P: [phone] 24/7 Helpline: [phone] Services include our 24-Hour Helplines, advocacy and accompaniment, support  groups, workshops, therapy, and case management. Serving survivors of sexual violence in the [place], [place], [place], and surrounding communities.  Shelter/Housing Services Eligibility  Entry [employer] [address] from 8:00 am to 3:00pm and at [employer] from 12:00 noon to 8:00 pm Monday through Friday. Aging and Adult Services in DSS is on the second floor in Lobby 27. Weekend or on a county holiday go to [employer] from 4:30 pm to 8:00 pm. *Note that during COVID-19 in person intake is not available and the number below should be called for phone intake. P: [phone] EgoNews.gl Provides coordinated entry for services included shelter, healthcare services, transportation.  Clients must be homeless in [facility].  [facility] Men[facility]:  [address] [place] [address] P: [phone] Ext. [phone] Women's Shelter: [address] [place] [address] P: [phone] Ext. [phone] [email or web address]://[email or web address]/ [employer], clothing, 3/meals per day, financial counseling, vocational training, GED education, and employment assistance.  Applicants must provide ID, or a printout of police criminal/history report. Must agree to all rules, and be able to work at the shelter for 40hrs/week. Sex offenders are not accepted. For women's shelter, children up to age 49 are accepted.   [employer] [address] P: [phone] The [facility] provides a tier of services both on- and off-site that are designed to lead men to self-sufficiency. The shelter provides some short-term emergency dorms with overnight shelter only, while other dorms offer longer-term shelter to men who engage in services. Those services range from case management, budgeting and credit counseling, mental health/substance abuse treatment and referrals to other programs. Call shelter  directly to determine availability.  Shelter for homeless men.   [facility]  CardSurfer.cz.org/mh/index.aspx Online resource directory that can be used to locate all resources in [facility]. Information on coordinated entry sites for shelter can be accessed through the site.  Can be used by anyone.   Food Services Eligibility  [employer] [facility] [address] [place], [place] [address] P: [phone] http://www.dixon.[email or web address]  Provides three meals a day. Normal schedule:  Mon-Friday: Breakfast and bag lunch pickup 8-9 a.m. Dinner 7 -8 p.m. Saturdays: Breakfast 9:30 a.m.-10:30 a.m. Lunch in the cafe 12:30-1:30 p.m. Dinner 6-7 p.m. Sunday and Holidays: Breakfast 9:30 a.m.-10:30 a.m. Lunch in the cafe 12:30-1:30 p.m. Dinner 6-7 p.m. COVID-19 Schedule: Dinner meal time is currently changed to 6 p.m. Available to anyone.   [place] [employer] - Management consultant [address] [place],  [place] [address] P: [phone] http://www.dixon.info/  Provides basic emergency services for food, clothing, diapers and hygiene supplies. The Food Pantry and Clothing Closet is open: Monday, Wednesday and Friday from 1-3 pm, Tuesday and Thursday from 9-11 am and 5-6:30 pm. Services are available to the first 20 households and 5 shelter clients during each time slot.  **Pantry and closet are currently not in operation due to COVID-19 stay at home orders.   Clothing: Photo ID and [place] address Food: Photo ID and documentation of one of the following criteria: Children living in household 58 years of age or younger Disability 85 years of age or older NEW: Underemployed (present work schedule showing fewer than 30 hours per week) Diapers: In order to receive diapers, a parent must show: Photo ID for themselves and ID for their child, with a birthdate (e.g. birth certificate, insurance card) Proof of residency within [facility], by showing a current address  with the same name as the photo ID from any of the following documents: bill, [facility] voucher, driver's license, or ID card.   [facility] - Food Pantry [address] [place] [address] **Use rear entrance to access food pantry. Ext [phone] P: [phone] https://www.aas-c.org/ Provides a food pantry for those affected by HIV/AIDS and/or low income individuals. Depending on what is available at the [employer] , [employer], or donated, we carry anything from dry goods, bread, milk, eggs, meats, and personal hygiene products. Schedule: Tuesday 12:30PM-6:00PM, Wednesday 12:30PM-4:00PM, Thursday 12:30PM-4:00PM Open to anyone in need.    Foodbank of [place]  VirginiaBeachSigns.tn [employer] for current food banks and operational hours. Can be accessed by anyone.      CALL 911 if this is a medical or life-threatening emergency.  If you need the police, ask for a CIT officer. They have received extra training in handling these situations. CALL 988, Text or Chat the 54 Suicide & Crisis Lifeline if you or someone you know need help with a mental health crisis. If this is NOT a medical or life-threatening emergency, look in the directory below for resources in your county.  FIND HELP FOR A MENTAL HEALTH OR SUBSTANCE USE CRISIS IN [employer]  [place]  Crisis Services for [facility] are managed by: [employer] YOU HAVE A CHOICE ABOUT HOW TO GET SERVICES WHEN YOU ARE IN A CRISIS Phone First. [facility] [employer] is available 24 hours a day, 7 days a week. Customer Service Specialists will assist you to find a crisis provider that is well-matched with your needs. Your local number is: [phone] If you already have a service provider, call them first. Providers who know you are usually best prepared to assist you in a crisis. Have Support Come to You. Crisis situations are often best  resolved at home. Mobile Crisis Teams are available 24 hours a day in all counties. Professional counselors will speak with you and your family during a visit. They have an average response time of 2 hours. This service is provided by:  [facility] [phone] Go To A Crisis Center. Many counties have a specialized crisis center where you can walk in for a crisis assessment and referrals to additional services. Appointments are not needed. The crisis center in your county is provided by:  [facility] [address], [place] [address] [phone] Sunday - Saturday - 24 hours/day   We serve individuals undergoing a mental health crisis, including substance  abuse problems that complicate other mental health issues. We Can Provide You With: Access to psychiatrists, nurses, social workers, and other clinical staff  A safe and respectful environment for people in distress  Medication evaluation  Appropriate referrals to other community resources  Targeted reminder calls for follow up services  Coordination of care with other service providers  Crisis Counseling  Speak to a trained clinician about current crises and potential solutions.  Provider Referrals Connect to a local provider that can meet ongoing therapeutic or psychiatric needs.= Psychiatric Services Meet with a psychiatrist to discuss medication needs or concerns. Bridge medication when appropriate. Case Management Review potential referral options with a clinician. Community Referrals.  [facility] Location [address], [place], [place] [address] Phone: [phone] HOURS: Monday-Thursday, 8 a.m. - 8 p.m.;  Friday, 8 a.m. - 3 p.m.; and  Saturday, 8 a.m. - 1 p.m.  The last person will be seen one hour before closing time.   Free Naloxone Community Resources: [facility]   [address], [place], [place]  Monday-Wednesday: 8:30am-7pm   Thursday-Friday: 8:30am-5pm    Crisis Text Line serves anyone, in any type of crisis, providing access to free, 24/7 support and information via a medium people already use and trust: text.  How to use:  1. Text HELLO to [phone] from anywhere in the [place] , 24/7. We will text about whatever is a crisis to you - addiction, anxiety, assault, bullying, depression, eating disorders, self-harm, and suicide.  2.  The Crisis Counselor listens without judgment, invites you to share more, and helps you move from a hot moment to a cool calm. You'll text back and forth, sharing only what you feel comfortable.   3.  After 2 automated responses, you'll connect with a live, trained volunteer Crisis Counselor who receives the text on their computer.  4.  The goal of the conversation is help you find calm. That may mean sharing resources for you to check out for more help; sometimes it means listening.  5.  It usually takes less than 5 minutes to connect you with a Crisis Counselor, maybe longer during busy Times.  6.   Conversations usually end when you and the Crisis Counselor feel comfortable that you're in a ?ool," safe place, after 15 - 45 minutes.  For more information you can visit: www.crisistextline.org   [place] - DSS [address] [place], [place] [address] Phone: [phone] Hours: 7:30 AM-5:30 PM Aging and Adult Services Adult Protective Services Adult Care Facilities & Family Care Home Adult Day/Health Care Programs & PACE Program CAP-DA (Community Alternative Program for Disabled Adults) Crisis Services Guardianship In - Home Care & Home Delivered Meals Non Emergency [facility] Transportation Special Assistance In-Home  [facility] Entry [place]  [phone]  Shelters in [facility]:  [facility] [address] Open 24 hours [phone]  [facility]  [address], [place] [address] [phone]  Food/Showers/Laundry in  [facility]:  [facility] [address], [place] [address] [phone]  Call for specific information pertaining to hours/days of operation for shelters/services. You can also call 211 from your phone for other general resources in your community.     [employer] of [place] is located in downtown [place] at: [address] [place], [place] [address] For more information, call [phone] Community Caf The Community Caf serves three meals a day, seven days a week, 365 days a  year to shelter residents and anyone else in need of a meal. The welcoming Caf area is a popular gathering place, and it is also used for community building and [employer].  On weekdays, to help people find and keep jobs, we offer a convenient bag lunch picked up at breakfast to take wherever their day may lead them.   Monday-Friday Breakfast and bag lunch pickup 8-9 a.m. Dinner 6-7 p.m. Saturday, Sunday and Holidays  Breakfast 9:30 a.m.-10:30 a.m. Lunch in the cafe 12:30-1:30 p.m. Dinner 6-7 p.m.   If you're homeless in [facility] and need to establish shelter, call [employer]: [phone]   Shelters in [facility]:  [employer] [address], [place] [phone]  [facility] [address] [place] [phone]  Emergency Shelter - [facility]. [facility] is serving as the main [employer] location for the 2021-2022 Winter Season .  [address] [place], [place] [address]. [phone] (call)   Food/Showers/Laundry in [place]:   [facility]  [address] [place], [place] [address] [phone]  [facility] (on [employer] [phone] [address] [place] [phone]    Call ahead for specific information pertaining to hours/days of operation for shelters/services. You can also call 211 from your phone for other general resources in your  community.   [place]  shelter and emergency housing is offered by the following: [employer], [address] (call [phone] ext. [phone])  Women & Family Shelter (phone number [phone]) [employer] [address] (phone [phone]) [employer] (dial [phone])   Housing Expense Assistance A local church which is a Chief Technology Officer is the [employer] (phone number [phone]). On average, $50 can be provided to help pay rent, utilities, and other emergency expenses. The programs are focused on families with young children, disabled, and senior citizens.   [facility] Department of Social Services - There are two housing and rent assistance focused programs offered. They include Elderly and Disabled Emergency Services (located at [address] and phone number [phone]) as well as Family with children Emergency Services ([address] call [phone]).  In addition to offering those rent and housing programs, the [place] DSS also administers various financial assistance programs such as TANF, [employer], and [employer]. The Department of Social Services also offers emergency assistance with paying rent, cooling bills and utilities, health care expenses, and also free food. so the amount of resources is extensive.   [employer] partners with DSS above, and they receive grants from that organization. The money is used to fund a program known as Family Empowerment Action to [employer], which can provide funds to pay rent. This non-profit can also often help with paying a security deposit on a new home or apartment. Call FEATSS Services at [phone] ext. [phone].   Another local religious organization/church is [employer] (phone [phone]) and it is located in [employer] on [address] Services are in high demand, so stop by and line up early for free food and  funds to pay rent or housing expenses like utilities    [employer] may be able to help those on the verge of homelessness and who have an eviction notice. Call [phone]. Some other forms of bills may be able to be paid too.   Emergency  funds are offered on a sliding fee scale by the [employer]. Small amounts of aid is offered in a crisis. They focus on stopping evictions, and may have occasional government grants for rental and other housing expenses. [address] [place]. Call [phone]   Churches and charities that are part of [employer] offer transitional housing to the low income and struggling. They also facilitate case management and may offer [place] families funds, or a no Occupational psychologist, for first months rent or other bills such as a security deposit. Many resources are available to clients of the charity. Call [phone] for information.   [facility] is made up of pro-bono attorneys and volunteers that offer free representation and advice. They deal with unsafe housing, work to prevent evictions, and mediate payment plans on overdue rent between tenants and landlords. Phone [phone].   [facility], located at [address] (call [phone]),can offer emergency funds to stop homelessness. Case managers also have information on government benefits, including ESG grants for paying rent, public housing vouchers, and other aid.   Grants for rapid rehousing and homeless prevention can help the homeless and tenants facing eviction. Whether it is emergency money for rent or access to free legal aid, support is arranged. More on [facility] homeless prevention.   [facility]     Phone: [phone]     Address: [address] [place], [place] [address] view map     [facility] provides a rent subsidy program that allows very low- income families, elderly or disabled individuals to live in privately  owned houses or apartments....      [facility]     Phone: [phone]     Address: [address] [place], [place] [address] view map     [employer] offers a variety of services and community involvement opportunities. Services include problem pregnancy counseling; short-term individual, couple and family counseling; emergency assistance (on a very limited basis); Case Management; a food pantry, nutrition             classes, and dress fo...   [place] [facility] Department of [employer]     Phone: [phone]     Address: [address] [place], [place] [address] view map     The Department of [employer] administers community development, housing production, service programs,and promotes affordable housing by working with investors and [employer]. Elderly or disabled adults can apply for grants for         repairs (leaking roof, rottin...   [facility] Department of Social Services     Phone: [phone]     Address: [address] [place], [place] [address] view map     [facility] Department of Social Services partners with families and community in achieving well-being through prosperity, permanence, safety and support. Services include: Sales executive, Medicaid, Work Social worker, Child Electronics engineer and Family Crisis Unit          Child Support Enforce...   [facility] Emergency Energy Committee     Phone: [phone]     Address: [address] [place], [place] [address] view map     [employer] is a cooperative community project sponsored by a number of [employer] and other groups. Provides emergency energy assistance to qualified community members from December 1st through March 31st. All funds raised through   community contributions. No fee charged.SABRASABRASABRA  [facility]     Phone: [phone]     Address: [address] [place], [place] [address] view map     [facility]  provides a rent subsidy program that allows very low- income families, elderly or disabled individuals to live in privately owned houses or apartments....   [employer]     Phone: [phone]     Address: [address] [place], [place] [address] view map     [facility] provides emergency financial assistance for food, rent, and utilities; also has a Software engineer; call on Wednesdays, 9:15am-noon. Furthermore, provides food Mondays and Wednesdays 9:30am-12:30p....   [employer] [facility] [facility]     Phone: [phone]     Address: [address] [place], [place] [address] view map    Our purpose is to provide funding for the building of homes for families in need through the sale of gently used household goods and used and surplus Production designer, theatre/television/film; while promoting environmentally friendly practices....   [facility]     Phone: [phone]     Address: [address] [address] [place], [place] [address] view map     Provides emergency assistance (financial, counseling referral) to families, disabled adults, and senior citizens in crisis. Where appropriate, assistance is given for rent, utility bills, prescription, and miscellaneous expenses such as diapers, special diets, and small transportation needs.            Housing...   [facility] for [employer]: Rapid Re-housing     Phone: [phone]     Address: [address] [place], [place] [address] view map     [facility] [place]'s Rapid Rehousing program helps families and individuals in transition secure permanent housing. We partner with [address] [place], [address], [employer], [employer] for Children, [employer] [place], and      many others to help [employer]...   [facility]     Phone: [phone]     Address: [address] [place], [place] [address] view map     [facility] provides a rent subsidy  program that allows very low- income families, elderly or disabled individuals to live in privately owned houses or apartments....   [facility]     Phone: [phone]     Address: [address] [place], [place] [address] view map     [facility] provides a rent subsidy program that allows very low- income families, elderly or disabled individuals to live in privately owned houses or apartments....   [employer]     Phone: [phone]     Address: [address] [place], [place] [address] view map     [employer] provides below market rental rates housing for qualified applicants. Please call for additional information....   [employer]     Phone: [phone]     Address: [address] [place], [place] [address] view map     [employer] provides a rent subsidy program that allows very low- income families, elderly or disabled individuals to live in privately owned houses or apartments....   Manufacturing engineer [employer]     Phone: [phone]     Address: [address] [place], [place] [address] view map     [employer] provides religious, Editor, commissioning building, welfare, and [employer] & Girls [employer] Club programs to [place],  [place], and [place]. We provide emergency food, rental and utility assistance; a Engineer, materials; ESL classes; counseling; tutoring; literacy for  children; organ...   [facility]     Phone: [phone]     Address: [address] [place], [place] [address] view map     Limited permanent housing assistance Rental subsidies and support services to homeless individuals with disabilities, primarily those with chronic mental illness, substance abuse, and HIV/AIDS...   [facility]     Phone: [phone]     Address: [address] [place], [place] [address] view map     [facility] provides a rent subsidy program that allows very low- income families, elderly or disabled individuals to live in privately owned houses  or apartments....   [employer]  [place]     Phone: [phone] ext. [phone]     Address: [address] [place], [place] [address] view map     [employer] provides religious, Editor, commissioning building, welfare, and Boys & Girls Club Club programs to [place], [place], and [place] counties. We provide emergency food, rental and utility assistance; a Engineer, materials; ESL classes; counseling; tutoring; literacy for  children; organ...   [employer] for the Prevention of Violence and Its Causes     Phone: [phone]     Address: [address] [place], [place] [address] view map     [employer] for the Prevention of Violence and Its Causes is a nonprofit, multiracial, culturally diverse organization dedicated to the prevention of violence and its causes in our community. [employer] for the Prevention of Violence and Its Causes work to improve human       relations, to help p...   [employer]     Phone: [phone]     Address: [address] [place], [place] [address] view map     [employer] provides below market rental rates housing for Consulting civil engineer. Please call for additional information....    [employer] in housing crisis can contact the Housing Helpline for assistance  o ([phone], available noon-4pm and overnights, Monday-Friday  o housinghelp@[place] .gov (link: mailto:housinghelp@[place] .gov) - emails returned during weekday office hours    Homeless Shelter Resources:   [facility] [employer] [address] [place], [place] [address] [phone]  [facility] [address] [place], [place] [address]  Phone: [phone] Monday through Friday Lunch 11:15am-12:30pm Dinner 6:15pm-7:00pm  Saturday Lunch 11:15am-12:30pm Sunday Lunch 12:15pm-1:30pm   [facility] [address] [place], [place]  [phone]   [facility] [address]  [place], [place] [address] [phone]   [facility] [address] [place], [place] [address] [phone]   [facility] [address], [place] [address] [phone]   [facility] [address] [place] [address] [phone]   [facility] [address] [place], [place]  [address] [phone]   [facility] [address] [place], [place] [address] [phone]   [facility] [address] [place], [place] [address] [phone]   [facility] 475 794 [address] [place], [place] [address] [phone]   [facility] [address] [place], [place]  [phone] Drop in center in [place] between 7-7:30   Housing for [facility] in [place], [place]  [phone]   [facility] [address] [place], [place] [address] [phone]  [facility] [address] [place],  [place]  [address] [phone]   Sherrilyn Silvan [address] [place], [place] [phone]   [facility] [address] [place]  [address] [phone] Ages 10-17 runaways   [facility] [address] [place], [place]  [address] [phone]     Insurance: Medicaid  PCP: Perri Constance Sor, PA    DISCHARGE NEEDS ANTICIPATED: Resources provided to patient and added to AVS  CM to continue to be available if needs arise.  Rosaline Greaser, MHA, MSW, LCSW, CCM [facility] ED Case Management Office Phone:  [phone] Pager: [phone]

## 2024-07-01 NOTE — Progress Notes (Signed)
 I performed a history and physical examination of Katrina Meyers as documented in the resident/fellow/APP note and discussed her management with:  Treatment Team:  Attending Provider: Raechel Erla Geralds, MD   I agree with the history, physical, assessment, and plan of care, with the following exceptions: None    I was present for the following procedures: None Time Spent in Critical Care of the patient: 31 min Time spent in discussions with the patient and family: 12 min  SOPHIE WOLFE GALSON

## 2024-07-01 NOTE — Progress Notes (Addendum)
 Pt back from CT, very agitated and boisterous. Pt yelling at staff and refusing care/assessments/to follow C-spine protocols. Pt educated on the purpose and importance of precautions to which patient aggressively disregards. ED attending at bedside. Call bell within reach.
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
## 2024-07-02 NOTE — Progress Notes (Signed)
 Pt left AMA, refused to sign paperwork, wait for an assessment, vitals or to speak with the provider. Provider aware. PIV removed.

## 2024-07-03 NOTE — Progress Notes (Signed)
 Emergency Department (ED) Resource Nursing Documentation of LWBS/AMA Disposition  Encounter Date: 07/01/2024  No resource needs: Augustin Plunk, PA, reviewed the above ED encounter after its conclusion, including nursing documentation, provider documentation, resulted labs, and resulted radiology. There are no immediate resource needs at this time. The patient is encouraged to follow-up with their primary care provider within 7 days. The ED remains an option should they not be able to arrange follow-up.

## 2024-07-05 ENCOUNTER — Encounter: Payer: Self-pay | Admitting: Emergency Medicine

## 2024-07-05 ENCOUNTER — Other Ambulatory Visit: Payer: Self-pay

## 2024-07-05 ENCOUNTER — Emergency Department
Admission: EM | Admit: 2024-07-05 | Discharge: 2024-07-06 | Disposition: A | Discharge status: Home / Self Care | Payer: MEDICAID | Attending: Emergency Medicine | Admitting: Emergency Medicine

## 2024-07-05 DIAGNOSIS — R1084 Generalized abdominal pain: Secondary | ICD-10-CM | POA: Diagnosis not present

## 2024-07-05 DIAGNOSIS — R109 Unspecified abdominal pain: Secondary | ICD-10-CM | POA: Diagnosis present

## 2024-07-05 LAB — CBC
HCT: 30 % — ABNORMAL LOW (ref 36.0–46.0)
Hemoglobin: 9.1 g/dL — ABNORMAL LOW (ref 12.0–15.0)
MCH: 23.5 pg — ABNORMAL LOW (ref 26.0–34.0)
MCHC: 30.3 g/dL (ref 30.0–36.0)
MCV: 77.3 fL — ABNORMAL LOW (ref 80.0–100.0)
Platelets: 315 K/uL (ref 150–400)
RBC: 3.88 MIL/uL (ref 3.87–5.11)
RDW: 16.6 % — ABNORMAL HIGH (ref 11.5–15.5)
WBC: 9.6 K/uL (ref 4.0–10.5)
nRBC: 0 % (ref 0.0–0.2)

## 2024-07-05 LAB — COMPREHENSIVE METABOLIC PANEL WITH GFR
ALT: 16 U/L (ref 0–44)
AST: 21 U/L (ref 15–41)
Albumin: 2.7 g/dL — ABNORMAL LOW (ref 3.5–5.0)
Alkaline Phosphatase: 74 U/L (ref 38–126)
Anion gap: 8 (ref 5–15)
BUN: 15 mg/dL (ref 6–20)
CO2: 24 mmol/L (ref 22–32)
Calcium: 8.4 mg/dL — ABNORMAL LOW (ref 8.9–10.3)
Chloride: 107 mmol/L (ref 98–111)
Creatinine, Ser: 1.68 mg/dL — ABNORMAL HIGH (ref 0.44–1.00)
GFR, Estimated: 41 mL/min — ABNORMAL LOW (ref >60)
Glucose, Bld: 91 mg/dL (ref 70–99)
Potassium: 3.7 mmol/L (ref 3.5–5.1)
Sodium: 139 mmol/L (ref 135–145)
Total Bilirubin: 0.5 mg/dL (ref 0.0–1.2)
Total Protein: 7.3 g/dL (ref 6.5–8.1)

## 2024-07-05 LAB — URINALYSIS, ROUTINE W REFLEX MICROSCOPIC
Bacteria, UA: NONE SEEN
Bilirubin Urine: NEGATIVE
Glucose, UA: NEGATIVE mg/dL
Ketones, ur: NEGATIVE mg/dL
Leukocytes,Ua: NEGATIVE
Nitrite: NEGATIVE
Protein, ur: >=300 mg/dL — AB
Specific Gravity, Urine: 1.035 — ABNORMAL HIGH (ref 1.005–1.030)
pH: 5 (ref 5.0–8.0)

## 2024-07-05 LAB — LIPASE, BLOOD: Lipase: 50 U/L (ref 11–51)
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
## 2024-07-05 NOTE — ED Notes (Signed)
 Pt states that she did not take her BP medication this morning.  No H/A or visual changes noted.
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
## 2024-07-07 LAB — URINE CULTURE

## 2024-07-07 LAB — POC URINE PREG, ED: Preg Test, Ur: NEGATIVE
